# Patient Record
Sex: Female | Born: 1965 | Race: Black or African American | Hispanic: No | Marital: Married | Smoking: Never smoker
Health system: Southern US, Community
[De-identification: ages and names within clinical notes are randomized; demographics above are authoritative.]

## PROBLEM LIST (undated history)

## (undated) DIAGNOSIS — E785 Hyperlipidemia, unspecified: Secondary | ICD-10-CM

## (undated) DIAGNOSIS — E079 Disorder of thyroid, unspecified: Secondary | ICD-10-CM

## (undated) DIAGNOSIS — I10 Essential (primary) hypertension: Secondary | ICD-10-CM

## (undated) HISTORY — DX: Disorder of thyroid, unspecified: E07.9

## (undated) HISTORY — DX: Hyperlipidemia, unspecified: E78.5

## (undated) HISTORY — PX: DOPPLER ECHOCARDIOGRAPHY: SHX263

---

## 1999-05-24 ENCOUNTER — Other Ambulatory Visit: Admission: RE | Admit: 1999-05-24 | Discharge: 1999-05-24 | Payer: Self-pay | Admitting: Family Medicine

## 1999-08-01 ENCOUNTER — Other Ambulatory Visit: Admission: RE | Admit: 1999-08-01 | Discharge: 1999-08-01 | Payer: Self-pay | Admitting: *Deleted

## 2000-07-18 ENCOUNTER — Other Ambulatory Visit: Admission: RE | Admit: 2000-07-18 | Discharge: 2000-07-18 | Payer: Self-pay | Admitting: Family Medicine

## 2002-03-19 ENCOUNTER — Encounter: Payer: Self-pay | Admitting: Emergency Medicine

## 2002-03-19 ENCOUNTER — Emergency Department (HOSPITAL_COMMUNITY): Admission: EM | Admit: 2002-03-19 | Discharge: 2002-03-19 | Payer: Self-pay | Admitting: Emergency Medicine

## 2009-12-14 ENCOUNTER — Other Ambulatory Visit: Admission: RE | Admit: 2009-12-14 | Discharge: 2009-12-14 | Payer: Self-pay | Admitting: Family Medicine

## 2011-09-25 ENCOUNTER — Encounter (HOSPITAL_COMMUNITY): Payer: Self-pay

## 2011-09-25 ENCOUNTER — Emergency Department (HOSPITAL_COMMUNITY)
Admission: EM | Admit: 2011-09-25 | Discharge: 2011-09-25 | Disposition: A | Discharge status: Home / Self Care | Payer: BC Managed Care – PPO | Attending: Emergency Medicine | Admitting: Emergency Medicine

## 2011-09-25 DIAGNOSIS — M542 Cervicalgia: Secondary | ICD-10-CM | POA: Insufficient documentation

## 2011-09-25 DIAGNOSIS — I10 Essential (primary) hypertension: Secondary | ICD-10-CM | POA: Insufficient documentation

## 2011-09-25 DIAGNOSIS — M62838 Other muscle spasm: Secondary | ICD-10-CM | POA: Insufficient documentation

## 2011-09-25 HISTORY — DX: Essential (primary) hypertension: I10

## 2011-09-25 MED ORDER — KETOROLAC TROMETHAMINE 60 MG/2ML IM SOLN
60.0000 mg | Freq: Once | INTRAMUSCULAR | Status: AC
  Administered 2011-09-25: 60 mg via INTRAMUSCULAR
  Filled 2011-09-25: qty 2

## 2011-09-25 MED ORDER — METHOCARBAMOL 500 MG PO TABS: 500.0000 mg | ORAL_TABLET | Freq: Two times a day (BID) | ORAL | Status: AC

## 2011-09-25 MED ORDER — IBUPROFEN 600 MG PO TABS: 600.0000 mg | ORAL_TABLET | Freq: Four times a day (QID) | ORAL | Status: AC | PRN

## 2011-09-25 MED ORDER — TRAMADOL HCL 50 MG PO TABS
50.0000 mg | ORAL_TABLET | Freq: Once | ORAL | Status: AC
  Administered 2011-09-25: 50 mg via ORAL
  Filled 2011-09-25: qty 1

## 2011-09-25 MED ORDER — METHOCARBAMOL 500 MG PO TABS
1000.0000 mg | ORAL_TABLET | Freq: Once | ORAL | Status: AC
  Administered 2011-09-25: 1000 mg via ORAL
  Filled 2011-09-25 (×2): qty 1

## 2011-09-25 MED ORDER — TRAMADOL HCL 50 MG PO TABS: 50.0000 mg | ORAL_TABLET | Freq: Four times a day (QID) | ORAL | Status: AC | PRN

## 2011-09-25 NOTE — Discharge Instructions (Signed)
Muscle Strain A muscle strain, or pulled muscle, occurs when a muscle is over-stretched. A small number of muscle fibers may also be torn. This is especially common in athletes. This happens when a sudden violent force placed on a muscle pushes it past its capacity. Usually, recovery from a pulled muscle takes 1 to 2 weeks. But complete healing will take 5 to 6 weeks. There are millions of muscle fibers. Following injury, your body will usually return to normal quickly. HOME CARE INSTRUCTIONS   While awake, apply ice to the sore muscle for 15 to 20 minutes each hour for the first 2 days. Put ice in a plastic bag and place a towel between the bag of ice and your skin.   Do not use the pulled muscle for several days. Do not use the muscle if you have pain.   You may wrap the injured area with an elastic bandage for comfort. Be careful not to bind it too tightly. This may interfere with blood circulation.   Only take over-the-counter or prescription medicines for pain, discomfort, or fever as directed by your caregiver. Do not use aspirin as this will increase bleeding (bruising) at injury site.   Warming up before exercise helps prevent muscle strains.  SEEK MEDICAL CARE IF:  There is increased pain or swelling in the affected area. MAKE SURE YOU:   Understand these instructions.   Will watch your condition.   Will get help right away if you are not doing well or get worse.  Document Released: 04/16/2005 Document Revised: 04/05/2011 Document Reviewed: 11/13/2006 [employer] Patient Information 2012 [employer]. 

## 2011-09-25 NOTE — ED Notes (Signed)
Symptoms onset on Friday, neck pain, tender to touch, no dizziness, nausea, headache, visual problem, no stiff neck, able to turn from side to side. Was lifting heavy object at work Thursday.

## 2011-09-25 NOTE — ED Provider Notes (Signed)
History     CSN: [id number]  Arrival date & time 09/25/11  4098   First MD Initiated Contact with Patient 09/25/11 0831      Chief Complaint  Patient presents with  . Neck Pain    (Consider location/radiation/quality/duration/timing/severity/associated sxs/prior treatment) HPI Pt woke with neck pain on Friday morning. She states she thinks she slept on it wrong. Pain is worse on the right and worse with movement or palpation. No trauma, fever, chills, rigidity, focal weakness, sensory changes. Pt has been taking aleve at home with some relief.  Past Medical History  Diagnosis Date  . Hypertension     Past Surgical History  Procedure Date  . Cesarean section     No family history on file.  History  Substance Use Topics  . Smoking status: Never Smoker   . Smokeless tobacco: Not on file  . Alcohol Use: Yes     occasionally    OB History    Grav Para Term Preterm Abortions TAB SAB Ect Mult Living                  Review of Systems  Constitutional: Negative for fever and chills.  HENT: Positive for neck pain. Negative for neck stiffness.   Musculoskeletal: Negative for back pain.  Skin: Negative for rash and wound.  Neurological: Negative for weakness, numbness and headaches.    Allergies  Review of patient's allergies indicates no known allergies.  Home Medications   Current Outpatient Rx  Name Route Sig Dispense Refill  . ALISKIREN-HYDROCHLOROTHIAZIDE 150-12.5 MG PO TABS Oral Take 1 tablet by mouth daily.    Marland Kitchen NAPROXEN SODIUM 220 MG PO TABS Oral Take 220 mg by mouth 2 (two) times daily with a meal. Pain    . IBUPROFEN 600 MG PO TABS Oral Take 1 tablet (600 mg total) by mouth every 6 (six) hours as needed for pain. 30 tablet 0  . METHOCARBAMOL 500 MG PO TABS Oral Take 1 tablet (500 mg total) by mouth 2 (two) times daily. 30 tablet 0  . TRAMADOL HCL 50 MG PO TABS Oral Take 1 tablet (50 mg total) by mouth every 6 (six) hours as needed for pain. 15 tablet 0     BP 137/97  Pulse 96  Temp(Src) 97.7 F (36.5 C) (Oral)  Resp 18  SpO2 99%  Physical Exam  Nursing note and vitals reviewed. Constitutional: She is oriented to person, place, and time. She appears well-developed and well-nourished. No distress.  HENT:  Head: Normocephalic and atraumatic.  Mouth/Throat: Oropharynx is clear and moist.  Eyes: EOM are normal. Pupils are equal, round, and reactive to light.  Neck: Normal range of motion. Neck supple.       No meningismus. TTP over R paraspinal muscles with spasm.   Cardiovascular: Normal rate and regular rhythm.   Pulmonary/Chest: Effort normal and breath sounds normal. No respiratory distress. She has no wheezes. She has no rales.  Abdominal: Soft. Bowel sounds are normal. There is no tenderness. There is no rebound and no guarding.  Musculoskeletal: Normal range of motion. She exhibits no edema and no tenderness.  Neurological: She is alert and oriented to person, place, and time.       5/5 motor, sensation intact  Skin: Skin is warm and dry. No rash noted. No erythema.  Psychiatric: She has a normal mood and affect. Her behavior is normal.    ED Course  Procedures (including critical care time)  Labs Reviewed - No  data to display No results found.   1. Spasm of cervical paraspinous muscle       MDM  Will treat symptomatically and d/c with RX. Return for worsening pain, fever, weakness.        Loren Racer, MD 09/25/11 [phone]

## 2014-02-22 ENCOUNTER — Ambulatory Visit (INDEPENDENT_AMBULATORY_CARE_PROVIDER_SITE_OTHER): Payer: Self-pay | Admitting: Emergency Medicine

## 2014-02-22 VITALS — BP 120/96 | HR 90 | Temp 98.3°F | Resp 18 | Ht 64.0 in | Wt 214.0 lb

## 2014-02-22 DIAGNOSIS — B356 Tinea cruris: Secondary | ICD-10-CM

## 2014-02-22 MED ORDER — ALUM SULFATE-CA ACETATE EX PACK: 1.0000 | PACK | Freq: Three times a day (TID) | CUTANEOUS | Status: DC

## 2014-02-22 MED ORDER — TERBINAFINE HCL 250 MG PO TABS: 250.0000 mg | ORAL_TABLET | Freq: Every day | ORAL | Status: DC

## 2014-02-22 NOTE — Patient Instructions (Signed)

## 2014-02-22 NOTE — Progress Notes (Signed)
[facility] [address] [place] [address] [phone]  Date:  02/22/2014   Name:  Ashlyne Olenick   DOB:  02/24/1966   MRN:  [id number]  PCP:  No PCP Per Patient    Chief Complaint: Rash   History of Present Illness:  Jane Nunez is a 48 y.o. very pleasant female patient who presents with the following:  Patient has a history of a rash on the inner thighs.  Started over a week ago and improved transiently with topical meds then returned. Pruritic No fever or chills.  No nausea or vomiting. No improvement with over the counter medications or other home remedies. Denies other complaint or health concern today.   There are no active problems to display for this patient.   Past Medical History  Diagnosis Date  . Hypertension     Past Surgical History  Procedure Laterality Date  . Cesarean section      History  Substance Use Topics  . Smoking status: Never Smoker   . Smokeless tobacco: Not on file  . Alcohol Use: Yes     Comment: occasionally    Family History  Problem Relation Age of Onset  . Hypertension Mother   . Diabetes Mother   . Aneurysm Father   . Hypertension Daughter     No Known Allergies  Medication list has been reviewed and updated.  No current outpatient prescriptions on file prior to visit.   No current facility-administered medications on file prior to visit.    Review of Systems:  As per HPI, otherwise negative.    Physical Examination: Filed Vitals:   02/22/14 1533  BP: 120/96  Pulse: 90  Temp: 98.3 F (36.8 C)  Resp: 18   Filed Vitals:   02/22/14 1533  Height: 5\' 4"  (1.626 m)  Weight: 214 lb (97.07 kg)   Body mass index is 36.72 kg/(m^2). Ideal Body Weight: Weight in (lb) to have BMI = 25: 145.3   GEN: WDWN, NAD, Non-toxic, Alert & Oriented x 3 HEENT: Atraumatic, Normocephalic.  Ears and Nose: No external deformity. EXTR: No clubbing/cyanosis/edema NEURO: Normal gait.  PSYCH: Normally  interactive. Conversant. Not depressed or anxious appearing.  Calm demeanor.  Tinea cruris   Assessment and Plan: Tinea cruris lamisil Dry powder Domeboro   Signed,  Ellison Carwin, MD

## 2014-03-07 ENCOUNTER — Other Ambulatory Visit: Payer: Self-pay | Admitting: Emergency Medicine

## 2014-03-09 NOTE — Telephone Encounter (Signed)
Do you want to RF this?

## 2015-06-17 ENCOUNTER — Encounter (HOSPITAL_COMMUNITY): Payer: Self-pay | Admitting: Family Medicine

## 2015-06-17 ENCOUNTER — Emergency Department (HOSPITAL_COMMUNITY)
Admission: EM | Admit: 2015-06-17 | Discharge: 2015-06-17 | Disposition: A | Discharge status: Home / Self Care | Payer: 59 | Attending: Emergency Medicine | Admitting: Emergency Medicine

## 2015-06-17 ENCOUNTER — Emergency Department (HOSPITAL_COMMUNITY): Payer: 59

## 2015-06-17 DIAGNOSIS — J069 Acute upper respiratory infection, unspecified: Secondary | ICD-10-CM | POA: Insufficient documentation

## 2015-06-17 DIAGNOSIS — I10 Essential (primary) hypertension: Secondary | ICD-10-CM | POA: Diagnosis not present

## 2015-06-17 DIAGNOSIS — R0602 Shortness of breath: Secondary | ICD-10-CM | POA: Diagnosis present

## 2015-06-17 LAB — CBC
HEMATOCRIT: 40.6 % (ref 36.0–46.0)
HEMOGLOBIN: 13.6 g/dL (ref 12.0–15.0)
MCH: 30.2 pg (ref 26.0–34.0)
MCHC: 33.5 g/dL (ref 30.0–36.0)
MCV: 90.2 fL (ref 78.0–100.0)
Platelets: DECREASED 10*3/uL (ref 150–400)
RBC: 4.5 MIL/uL (ref 3.87–5.11)
RDW: 13 % (ref 11.5–15.5)
WBC: 4.4 10*3/uL (ref 4.0–10.5)

## 2015-06-17 LAB — BASIC METABOLIC PANEL
ANION GAP: 13 (ref 5–15)
BUN: 8 mg/dL (ref 6–20)
CO2: 19 mmol/L — AB (ref 22–32)
Calcium: 9.1 mg/dL (ref 8.9–10.3)
Chloride: 103 mmol/L (ref 101–111)
Creatinine, Ser: 0.67 mg/dL (ref 0.44–1.00)
GFR calc Af Amer: >60 mL/min (ref >60)
GLUCOSE: 90 mg/dL (ref 65–99)
POTASSIUM: 4.3 mmol/L (ref 3.5–5.1)
Sodium: 135 mmol/L (ref 135–145)

## 2015-06-17 LAB — I-STAT TROPONIN, ED: Troponin i, poc: 0 ng/mL (ref 0.00–0.08)

## 2015-06-17 IMAGING — DX DG CHEST 2V
2 series · 2 of 2 positions shown · non-contrast
Comparison: None.

CLINICAL DATA: Chills

EXAM:
CHEST  2 VIEW

[chest pa]
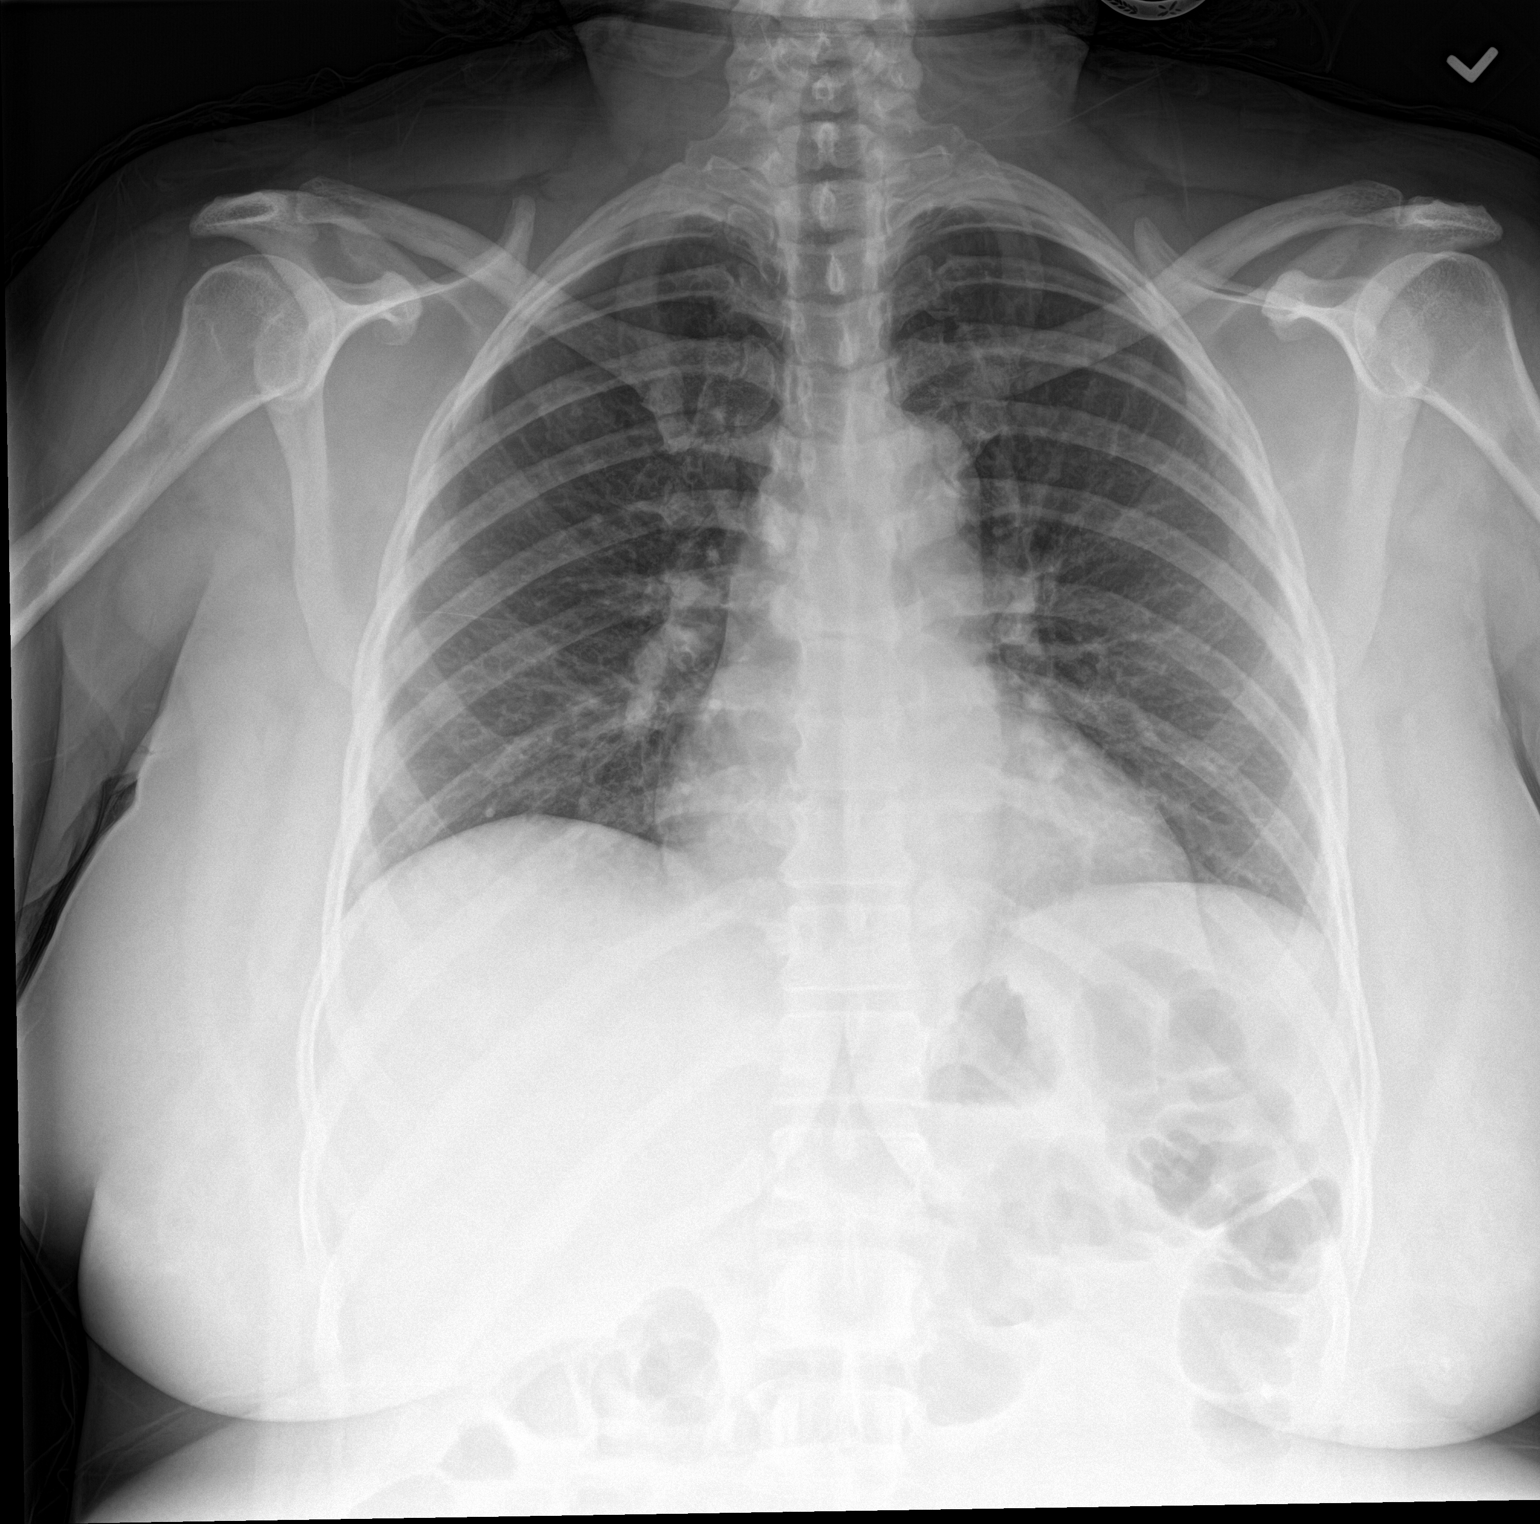

[chest lat]
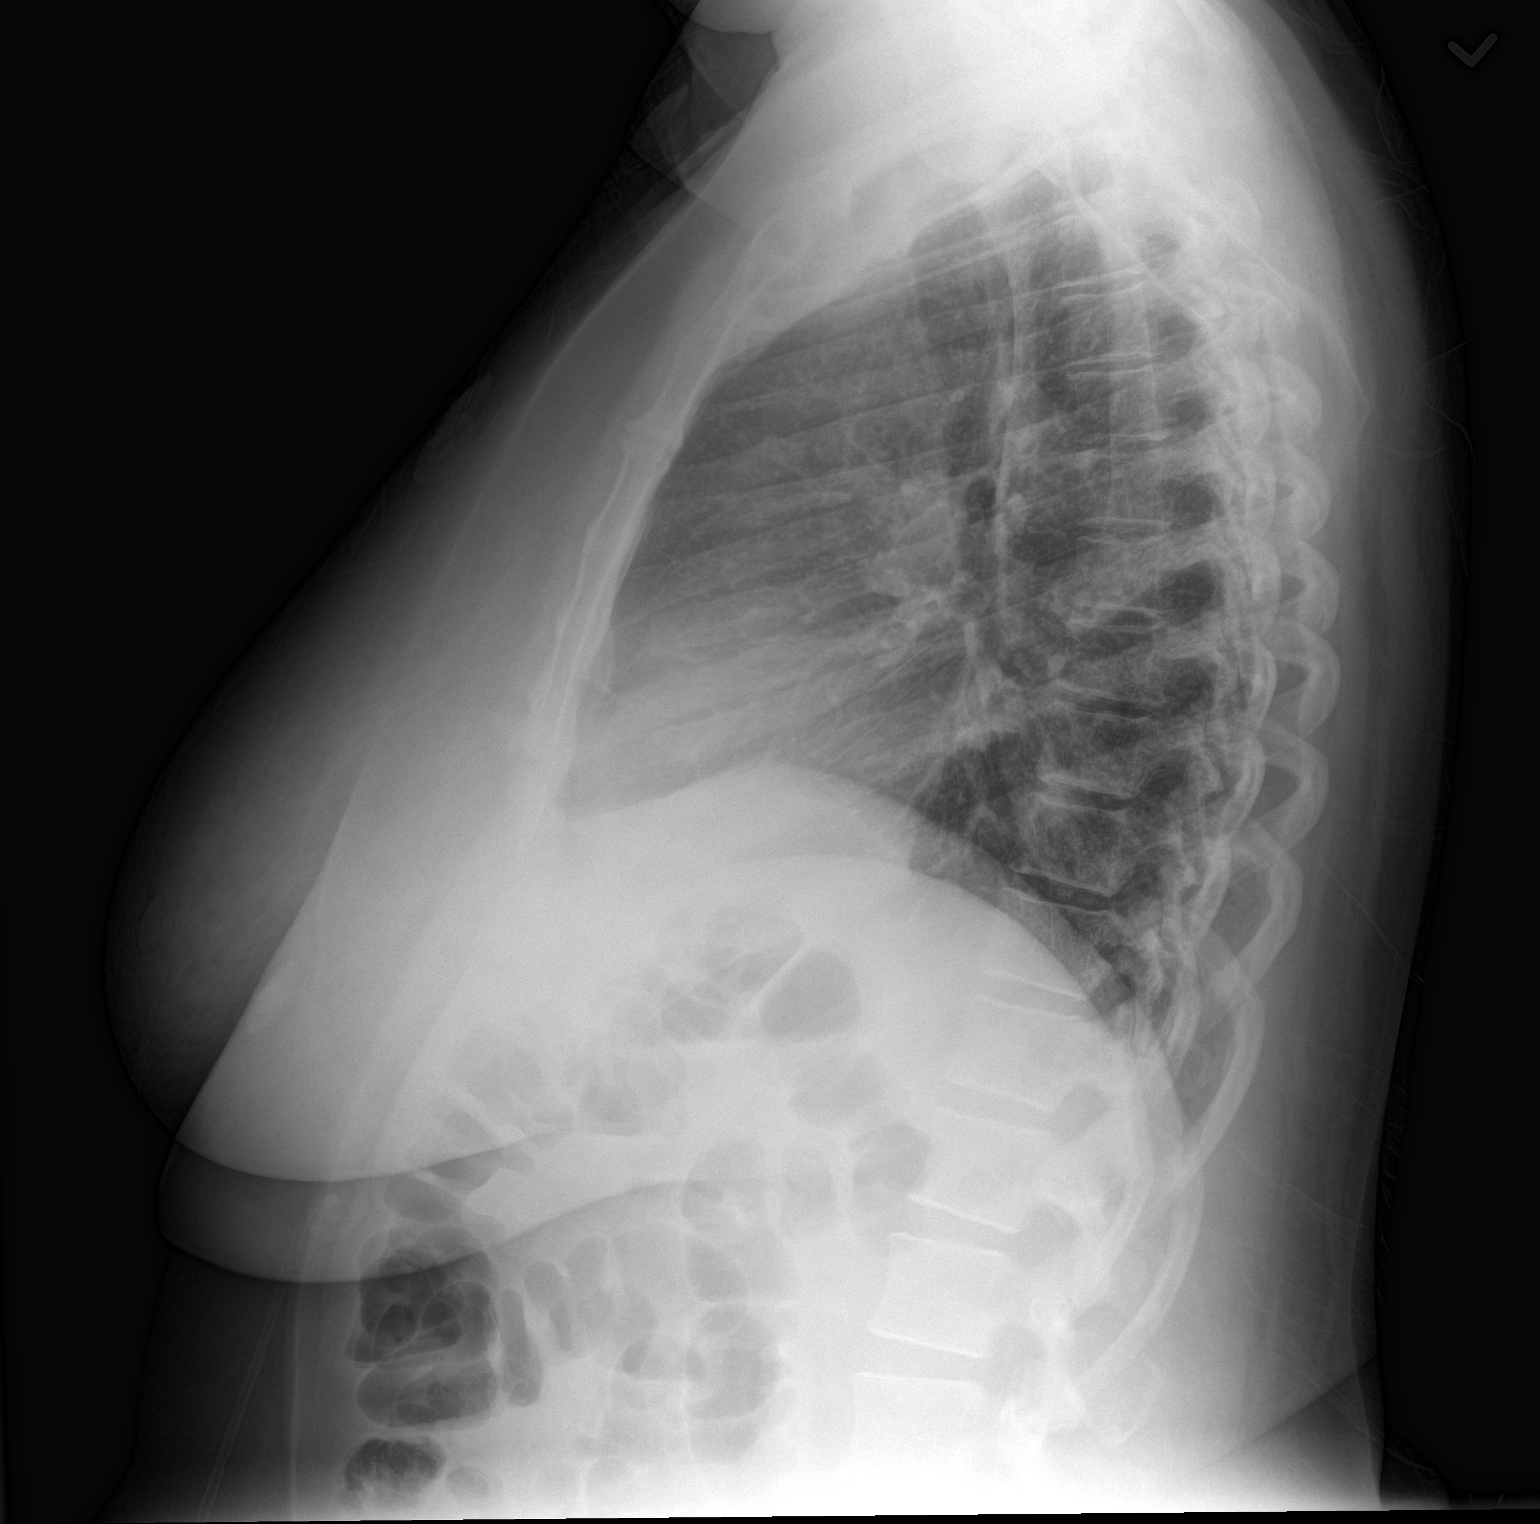

[2 of 2 positions shown; findings below may reference images not displayed]

FINDINGS: The heart size and mediastinal contours are within normal limits.
Both lungs are clear. The visualized skeletal structures are
unremarkable.
IMPRESSION: No active cardiopulmonary disease.

## 2015-06-17 MED ORDER — BENZONATATE 100 MG PO CAPS: 100.0000 mg | ORAL_CAPSULE | Freq: Three times a day (TID) | ORAL | Status: DC

## 2015-06-17 MED ORDER — GUAIFENESIN ER 600 MG PO TB12: 600.0000 mg | ORAL_TABLET | Freq: Two times a day (BID) | ORAL | Status: DC

## 2015-06-17 NOTE — ED Notes (Signed)
Pt discharged with all belongings. Follow-up instructions given and pt verbalized understanding.

## 2015-06-17 NOTE — ED Provider Notes (Signed)
CSN: [id number]     Arrival date & time 06/17/15  1128 History   First MD Initiated Contact with Patient 06/17/15 [phone]     Chief Complaint  Patient presents with  . Shortness of Breath  . Cough    HPI Comments: Patient started having symptoms a few days ago where she felt like she was getting the flu. She began having trouble with shortness of breath and cough. She developed mucus and congestion in her chest. She felt like it started to affect her breathing so she decided coming to the emergency room for evaluation. She denies any trouble with any chest pain. No leg swelling. No history of pulmonary embolism or DVT. Does have a history of hypertension and used to be on medications but her doctor took her off them because her blood pressure remained low and she was in a very low dose of medications. She has been taking over-the-counter medications for her cough and congestion recently.  Patient is a 50 y.o. female presenting with shortness of breath and cough.  Shortness of Breath Severity:  Moderate Onset quality:  Gradual Duration: a few days. Timing:  Constant Chronicity:  New Relieved by:  Nothing Ineffective treatments: OTC cough medications. Associated symptoms: cough   Associated symptoms: no abdominal pain, no fever, no PND, no rash, no sore throat, no sputum production and no wheezing   Risk factors: no family hx of DVT, no hx of PE/DVT and no prolonged immobilization   Cough Associated symptoms: shortness of breath   Associated symptoms: no fever, no rash, no sore throat and no wheezing     Past Medical History  Diagnosis Date  . Hypertension    Past Surgical History  Procedure Laterality Date  . Cesarean section     Family History  Problem Relation Age of Onset  . Hypertension Mother   . Diabetes Mother   . Aneurysm Father   . Hypertension Daughter    Social History  Substance Use Topics  . Smoking status: Never Smoker   . Smokeless tobacco: None  . Alcohol  Use: Yes     Comment: occasionally   OB History    No data available     Review of Systems  Constitutional: Negative for fever.  HENT: Negative for sore throat.   Respiratory: Positive for cough and shortness of breath. Negative for sputum production and wheezing.   Cardiovascular: Negative for PND.  Gastrointestinal: Negative for abdominal pain.  Skin: Negative for rash.  All other systems reviewed and are negative.     Allergies  Review of patient's allergies indicates no known allergies.  Home Medications   Prior to Admission medications   Medication Sig Start Date End Date Taking? Authorizing Provider  aluminum sulfate-calcium acetate (DOMEBORO) packet Apply 1 packet topically 3 (three) times daily. Patient not taking: Reported on 06/17/2015 02/22/14   Roselee Culver, MD  terbinafine (LAMISIL) 250 MG tablet TAKE 1 TABLET (250 MG TOTAL) BY MOUTH DAILY. Patient not taking: Reported on 06/17/2015 03/10/14   Roselee Culver, MD   BP 152/98 mmHg  Pulse 80  Temp(Src) 98.1 F (36.7 C) (Oral)  Resp 24  SpO2 96% Physical Exam  Constitutional: She appears well-developed and well-nourished. No distress.  HENT:  Head: Normocephalic and atraumatic.  Right Ear: External ear normal.  Left Ear: External ear normal.  Eyes: Conjunctivae are normal. Right eye exhibits no discharge. Left eye exhibits no discharge. No scleral icterus.  Neck: Neck supple. No tracheal deviation present.  Cardiovascular: Normal rate, regular rhythm and intact distal pulses.   Pulmonary/Chest: Effort normal and breath sounds normal. No stridor. No respiratory distress. She has no wheezes. She has no rales.  Abdominal: Soft. Bowel sounds are normal. She exhibits no distension. There is no tenderness. There is no rebound and no guarding.  Musculoskeletal: She exhibits no edema or tenderness.  Neurological: She is alert. She has normal strength. No cranial nerve deficit (no facial droop, extraocular  movements intact, no slurred speech) or sensory deficit. She exhibits normal muscle tone. She displays no seizure activity. Coordination normal.  Skin: Skin is warm and dry. No rash noted.  Psychiatric: She has a normal mood and affect.  Nursing note and vitals reviewed.   ED Course  Procedures (including critical care time) Labs Review Labs Reviewed  BASIC METABOLIC PANEL - Abnormal; Notable for the following:    CO2 19 (*)    All other components within normal limits  CBC  I-STAT TROPOININ, ED    Imaging Review Dg Chest 2 View  06/17/2015  CLINICAL DATA:  Chills EXAM: CHEST  2 VIEW COMPARISON:  None. FINDINGS: The heart size and mediastinal contours are within normal limits. Both lungs are clear. The visualized skeletal structures are unremarkable. IMPRESSION: No active cardiopulmonary disease. Electronically Signed   By: Marybelle Killings M.D.   On: 06/17/2015 12:41   I have personally reviewed and evaluated these images and lab results as part of my medical decision-making.   EKG Interpretation   Date/Time:  Friday June 17 2015 12:14:14 EST Ventricular Rate:  80 PR Interval:  166 QRS Duration: 76 QT Interval:  392 QTC Calculation: 452 R Axis:   29 Text Interpretation:  Normal sinus rhythm Normal ECG No previous tracing  Confirmed by Francesco Provencal  MD-J, Yichen Gilardi [id number]) on 06/17/2015 4:02:51 PM      MDM   Final diagnoses:  URI, acute    Patient is breathing easily, she has a normal oxygen saturation. She has no leg swelling or risk factors for pulmonary embolism.  PERC negative.  Doubt PE.  HTN but labs are normal and no evidence of CHF.  Doubt this is causing her respiratory difficulty.  Over-the-counter cold medications could be contributing. We discussed follow-up with the primary care doctor once her illness has resolved  The patient's symptoms are suggestive of a viral upper respiratory infection. We will discharge home with prescription for Tessalon and guaifenesin. Follow  up with her primary doctor if not getting better in one week     Dorie Rank, MD 06/17/15 [phone]

## 2015-06-17 NOTE — ED Notes (Signed)
Pt here for cough, SOB with exertion. Denies chest pain. sts congestion. Pt hypertensive at triage 178/117

## 2015-06-17 NOTE — Discharge Instructions (Signed)
Upper Respiratory Infection, Adult Most upper respiratory infections (URIs) are a viral infection of the air passages leading to the lungs. A URI affects the nose, throat, and upper air passages. The most common type of URI is nasopharyngitis and is typically referred to as "the common cold." URIs run their course and usually go away on their own. Most of the time, a URI does not require medical attention, but sometimes a bacterial infection in the upper airways can follow a viral infection. This is called a secondary infection. Sinus and middle ear infections are common types of secondary upper respiratory infections. Bacterial pneumonia can also complicate a URI. A URI can worsen asthma and chronic obstructive pulmonary disease (COPD). Sometimes, these complications can require emergency medical care and may be life threatening.  CAUSES Almost all URIs are caused by viruses. A virus is a type of germ and can spread from one person to another.  RISKS FACTORS You may be at risk for a URI if:   You smoke.   You have chronic heart or lung disease.  You have a weakened defense (immune) system.   You are very young or very old.   You have nasal allergies or asthma.  You work in crowded or poorly ventilated areas.  You work in health care facilities or schools. SIGNS AND SYMPTOMS  Symptoms typically develop 2-3 days after you come in contact with a cold virus. Most viral URIs last 7-10 days. However, viral URIs from the influenza virus (flu virus) can last 14-18 days and are typically more severe. Symptoms may include:   Runny or stuffy (congested) nose.   Sneezing.   Cough.   Sore throat.   Headache.   Fatigue.   Fever.   Loss of appetite.   Pain in your forehead, behind your eyes, and over your cheekbones (sinus pain).  Muscle aches.  DIAGNOSIS  Your health care provider may diagnose a URI by:  Physical exam.  Tests to check that your symptoms are not due to  another condition such as:  Strep throat.  Sinusitis.  Pneumonia.  Asthma. TREATMENT  A URI goes away on its own with time. It cannot be cured with medicines, but medicines may be prescribed or recommended to relieve symptoms. Medicines may help:  Reduce your fever.  Reduce your cough.  Relieve nasal congestion. HOME CARE INSTRUCTIONS   Take medicines only as directed by your health care provider.   Gargle warm saltwater or take cough drops to comfort your throat as directed by your health care provider.  Use a warm mist humidifier or inhale steam from a shower to increase air moisture. This may make it easier to breathe.  Drink enough fluid to keep your urine clear or pale yellow.   Eat soups and other clear broths and maintain good nutrition.   Rest as needed.   Return to work when your temperature has returned to normal or as your health care provider advises. You may need to stay home longer to avoid infecting others. You can also use a face mask and careful hand washing to prevent spread of the virus.  Increase the usage of your inhaler if you have asthma.   Do not use any tobacco products, including cigarettes, chewing tobacco, or electronic cigarettes. If you need help quitting, ask your health care provider. PREVENTION  The best way to protect yourself from getting a cold is to practice good hygiene.   Avoid oral or hand contact with people with cold   symptoms.   Wash your hands often if contact occurs.  There is no clear evidence that vitamin C, vitamin E, echinacea, or exercise reduces the chance of developing a cold. However, it is always recommended to get plenty of rest, exercise, and practice good nutrition.  SEEK MEDICAL CARE IF:   You are getting worse rather than better.   Your symptoms are not controlled by medicine.   You have chills.  You have worsening shortness of breath.  You have brown or red mucus.  You have yellow or brown nasal  discharge.  You have pain in your face, especially when you bend forward.  You have a fever.  You have swollen neck glands.  You have pain while swallowing.  You have white areas in the back of your throat. SEEK IMMEDIATE MEDICAL CARE IF:   You have severe or persistent:  Headache.  Ear pain.  Sinus pain.  Chest pain.  You have chronic lung disease and any of the following:  Wheezing.  Prolonged cough.  Coughing up blood.  A change in your usual mucus.  You have a stiff neck.  You have changes in your:  Vision.  Hearing.  Thinking.  Mood. MAKE SURE YOU:   Understand these instructions.  Will watch your condition.  Will get help right away if you are not doing well or get worse.   This information is not intended to replace advice given to you by your health care provider. Make sure you discuss any questions you have with your health care provider.   Document Released: 10/10/2000 Document Revised: 08/31/2014 Document Reviewed: 07/22/2013 Elsevier Interactive Patient Education 2016 [employer]  

## 2021-01-10 ENCOUNTER — Other Ambulatory Visit: Payer: Self-pay | Admitting: Obstetrics and Gynecology

## 2021-01-10 DIAGNOSIS — R1900 Intra-abdominal and pelvic swelling, mass and lump, unspecified site: Secondary | ICD-10-CM

## 2021-01-26 ENCOUNTER — Ambulatory Visit
Admission: RE | Admit: 2021-01-26 | Discharge: 2021-01-26 | Disposition: A | Discharge status: Home / Self Care | Payer: 59 | Source: Ambulatory Visit | Attending: Obstetrics and Gynecology | Admitting: Obstetrics and Gynecology

## 2021-01-26 ENCOUNTER — Other Ambulatory Visit: Payer: Self-pay

## 2021-01-26 DIAGNOSIS — R1900 Intra-abdominal and pelvic swelling, mass and lump, unspecified site: Secondary | ICD-10-CM

## 2021-01-26 IMAGING — MR MR ABDOMEN WO/W CM
9 of 12 series · 29 of 48 positions shown · IV contrast (multihance)
Comparison: None.

CLINICAL DATA: Stomach swelling, nausea, fibroids

EXAM:
MRI ABDOMEN AND PELVIS WITHOUT AND WITH CONTRAST
TECHNIQUE: Multiplanar multisequence MR imaging of the abdomen and pelvis was
performed both before and after the administration of intravenous
contrast.
CONTRAST:  20mL MULTIHANCE GADOBENATE DIMEGLUMINE 529 MG/ML IV SOLN

[Series 3: cor haste · coronal · 5.0mm · 0.78mm/px · 2 of 46 slices shown]
[im 1/46]
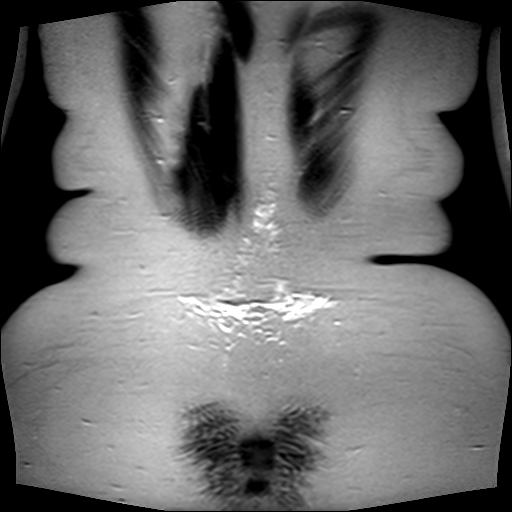
[im 46/46]
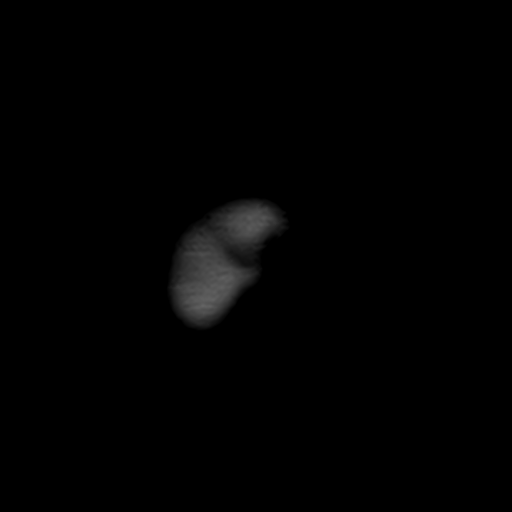

[Series 4: axial haste · axial · 6.0mm · 0.78mm/px · z∈[+52,+275]mm · 2 of 35 slices shown]
[im 1/35]
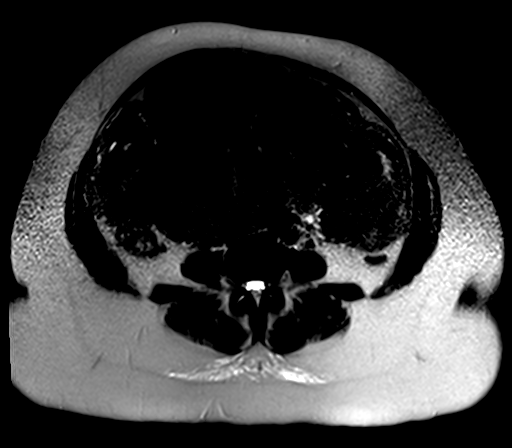
[im 35/35]
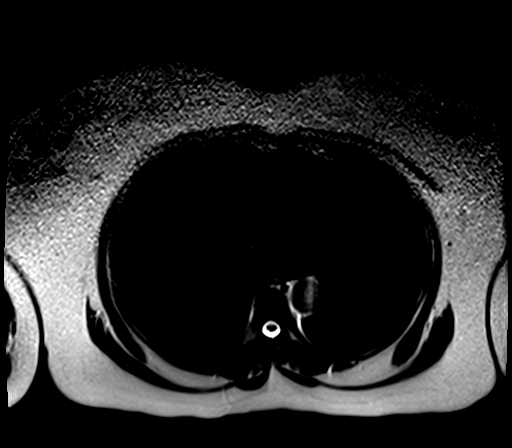

[Series 5: T1 · axial · 6.0mm · 0.78mm/px · z∈[+48,+279]mm · 4 of 72 slices shown]
[im 1/72]
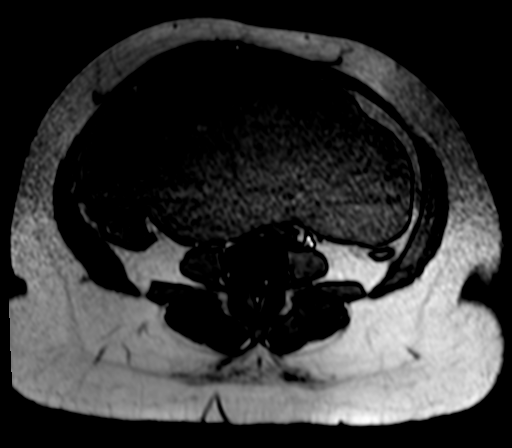
[im 24/72]
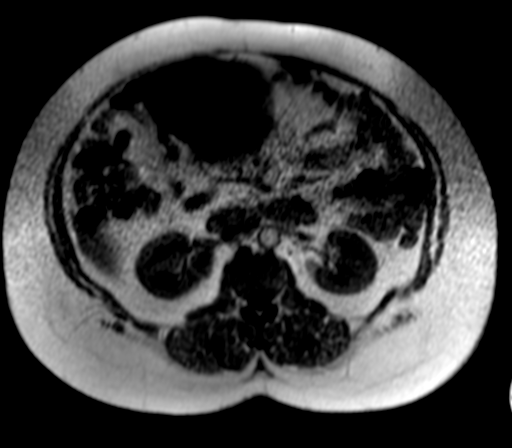
[im 48/72]
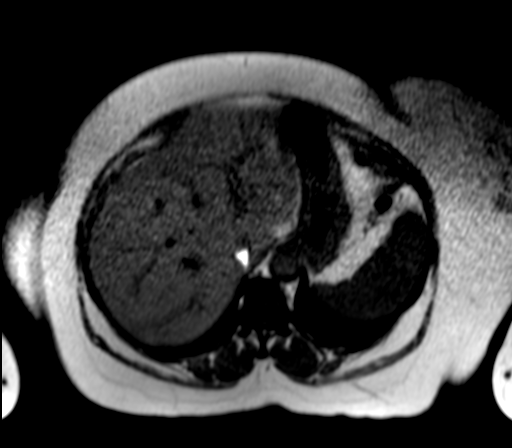
[im 72/72]
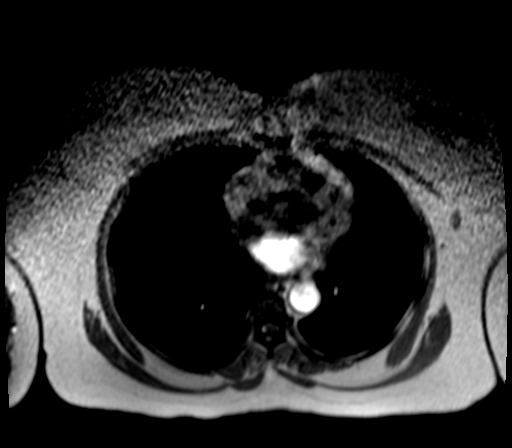

[Series 6: bSSFP · axial · 4.0mm · 0.78mm/px · z∈[+44,+283]mm · 4 of 61 slices shown]
[im 1/61]
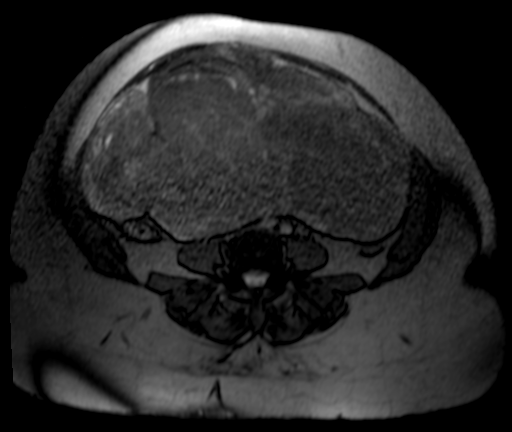
[im 21/61]
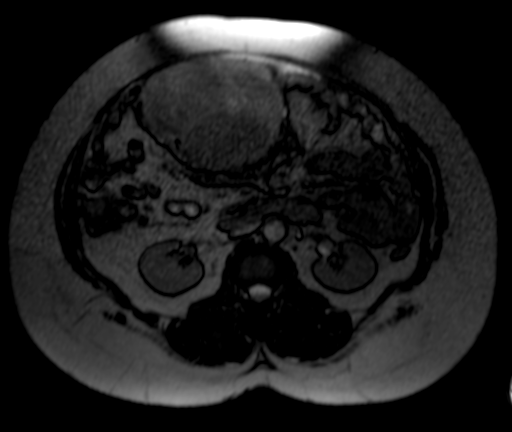
[im 41/61]
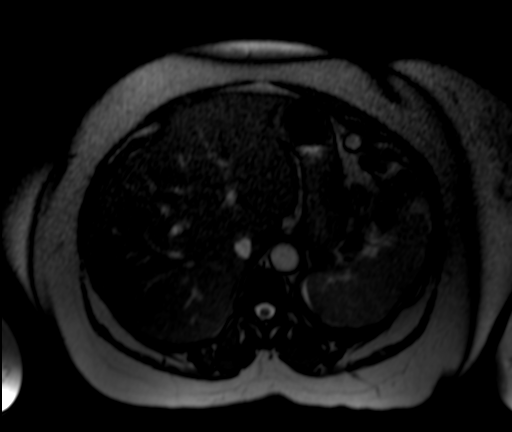
[im 61/61]
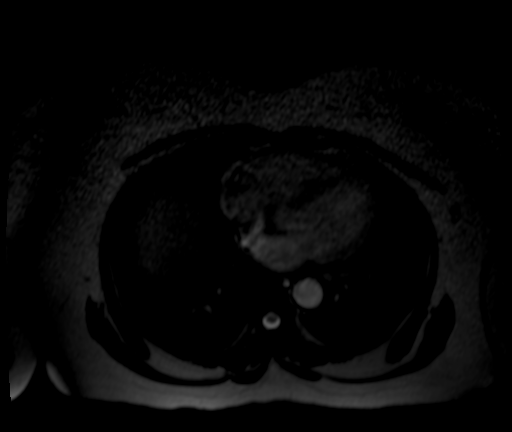

[Series 7: T2 fat-sat · axial · 6.0mm · 1.25mm/px · z∈[+62,+299]mm · 2 of 34 slices shown]
[im 1/34]
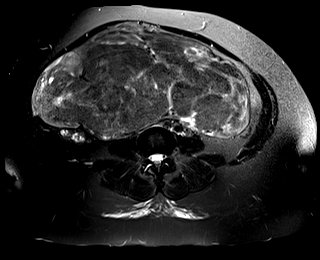
[im 34/34]
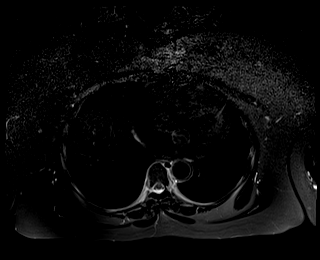

[Series 8: [id number]_trig · axial · 6.0mm · 2.08mm/px · z∈[+62,+299]mm · 6 of 102 slices shown]
[im 1/102]
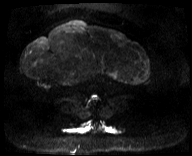
[im 21/102]
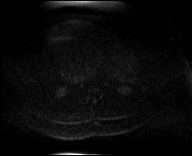
[im 41/102]
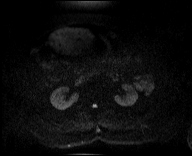
[im 61/102]
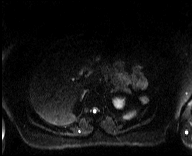
[im 81/102]
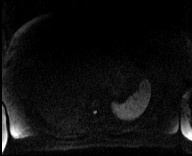
[im 102/102]
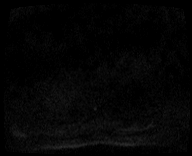

[Series 9: [id number]_trig_adc · axial · 6.0mm · 2.08mm/px · z∈[+62,+299]mm · 2 of 34 slices shown]
[im 1/34]
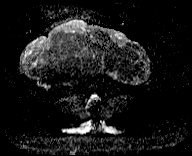
[im 34/34]
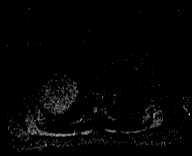

[Series 10: T1 dynamic · axial · non-contrast · 2.5mm · 0.78mm/px · z∈[+45,+282]mm · 6 of 96 slices shown]
[im 1/96]
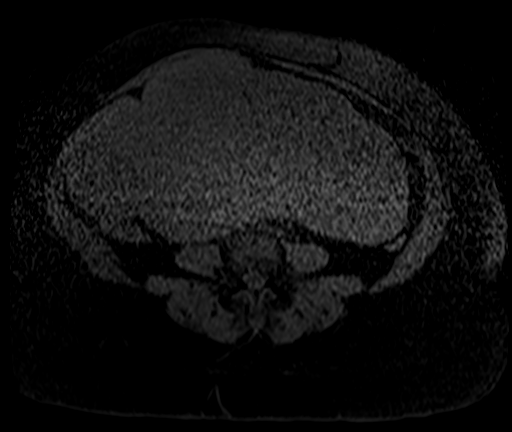
[im 20/96]
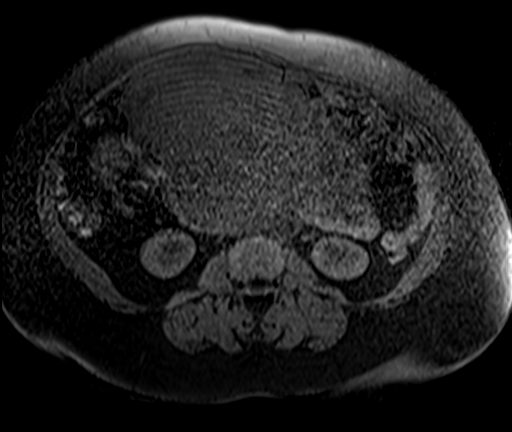
[im 39/96]
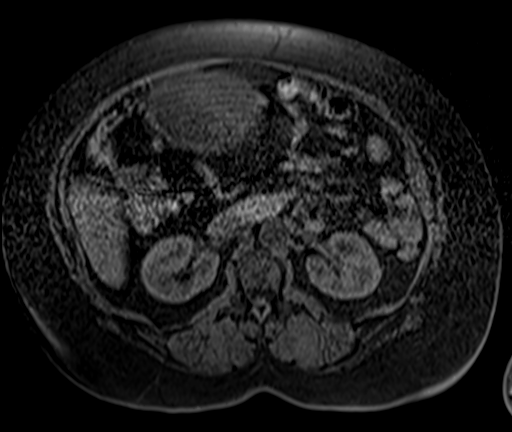
[im 58/96]
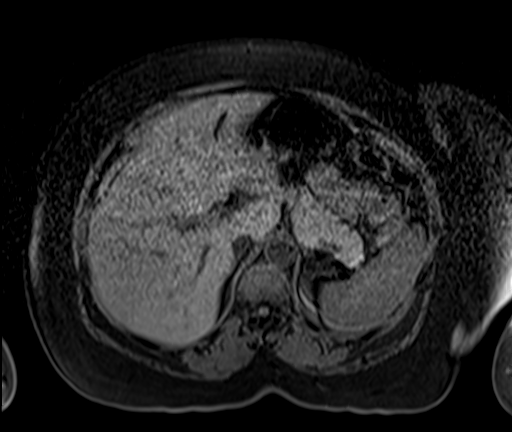
[im 77/96]
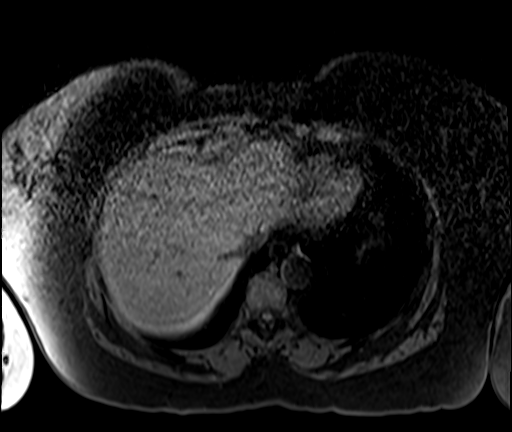
[im 96/96]
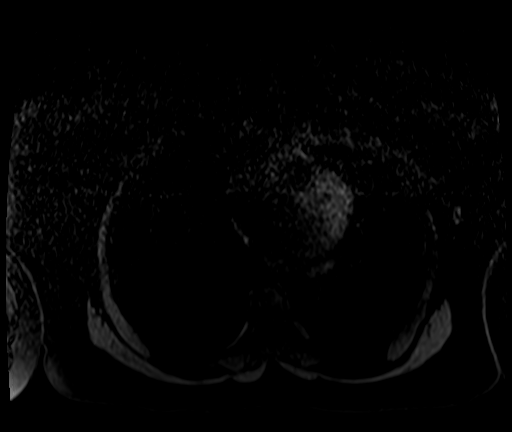

[Series 13: cor haste (include · coronal · 6.0mm · 0.78mm/px · 1 of 38 slices shown]
[im 1/38]
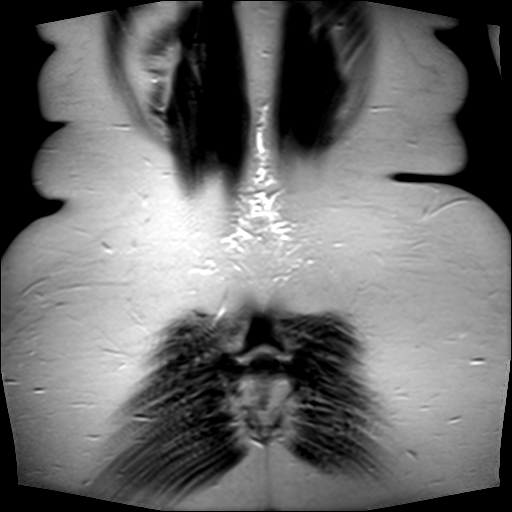

[29 of 48 positions shown; findings below may reference images not displayed]

FINDINGS: COMBINED FINDINGS FOR BOTH MR ABDOMEN AND PELVIS

Lower chest: No acute findings.

Hepatobiliary: Hepatic steatosis, somewhat geographic in the right
lobe. No mass or other parenchymal abnormality identified. No
gallstones. No biliary ductal dilatation.

Pancreas: No mass, inflammatory changes, or other parenchymal
abnormality identified. No pancreatic ductal dilatation.

Spleen:  Within normal limits in size and appearance.

Adrenals/Urinary Tract: No masses identified. No evidence of
hydronephrosis.

Stomach/Bowel: Visualized portions within the abdomen are
unremarkable.

Vascular/Lymphatic: No pathologically enlarged lymph nodes
identified. No abdominal aortic aneurysm demonstrated.

Reproductive: Extremely bulky fibroid uterus, with numerous fibroids
that grossly distort and efface the endometrial cavity. The overall
dimensions of the uterus are at least 26.0 x 25.9 x 14.3 cm. The
largest fibroid is an exophytic or pedunculated fibroid of the
posterior uterine body measuring at least 21.2 x 19.6 x 14.3 cm,
which extends into the upper abdomen.

Other:  None.

Musculoskeletal: No suspicious bone lesions identified.
IMPRESSION: 1. Extremely bulky fibroid uterus, with numerous fibroids that
grossly distort and efface the endometrial cavity. The overall
dimensions of the uterus are at least 26.0 x 25.9 x 14.3 cm.
2. The largest fibroid is an exophytic or pedunculated fibroid of
the posterior uterine body measuring at least 21.2 x 19.6 x 14.3 cm,
which extends into the upper abdomen.
3. Hepatic steatosis.

## 2021-01-26 IMAGING — MR MR PELVIS WO/W CM
18 series · 48 of 48 positions shown · IV contrast (20ml Multihance)
Comparison: None.

CLINICAL DATA: Stomach swelling, nausea, fibroids

EXAM:
MRI ABDOMEN AND PELVIS WITHOUT AND WITH CONTRAST
TECHNIQUE: Multiplanar multisequence MR imaging of the abdomen and pelvis was
performed both before and after the administration of intravenous
contrast.
CONTRAST:  20mL MULTIHANCE GADOBENATE DIMEGLUMINE 529 MG/ML IV SOLN

[Series 1: T2 · axial · 5.0mm · 1.45mm/px · 1 of 55 slices shown]
[im 1/55]
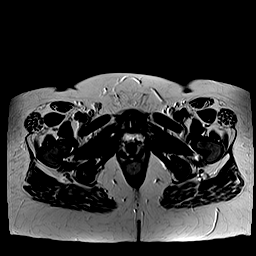

[Series 2: t2_tse axial fs · axial · 5.0mm · 1.45mm/px · 1 of 55 slices shown]
[im 1/55]
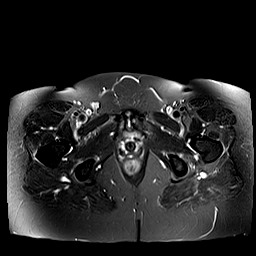

[Series 3: t2_tse_sag · sagittal · 5.0mm · 1.41mm/px · 1 of 56 slices shown (1 of 2)]
[im 1/56]
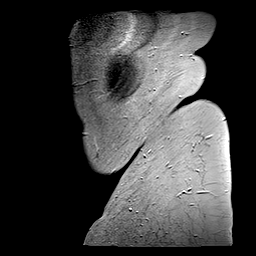

[Series 4: cor haste small · coronal · 5.0mm · 0.68mm/px · 1 of 32 slices shown]
[im 1/32]
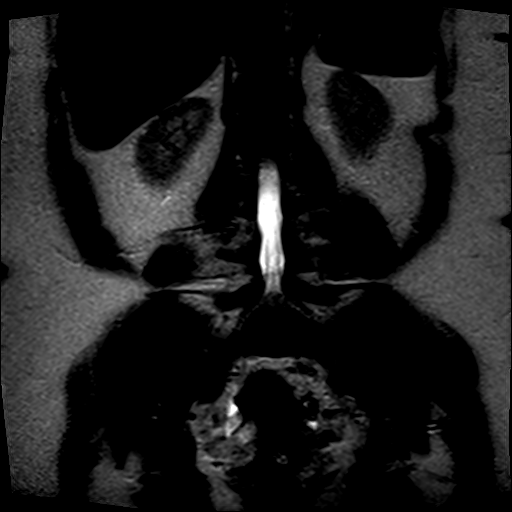

[Series 5: DWI · axial · 6.0mm · 1.93mm/px · z∈[-129,+195]mm · 2 of 138 slices shown]
[im 1/138]
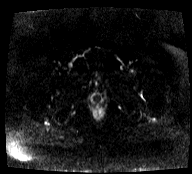
[im 138/138]
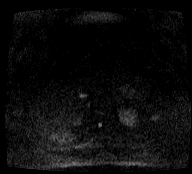

[Series 6: axial dwi_adc · axial · 6.0mm · 1.93mm/px · 1 of 46 slices shown]
[im 1/46]
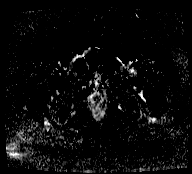

[Series 7: T1 · axial · 5.0mm · 0.66mm/px · z∈[-124,+200]mm · 2 of 110 slices shown]
[im 1/110]
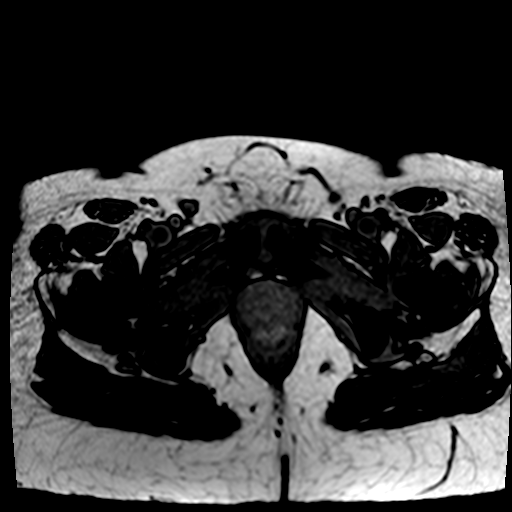
[im 110/110]
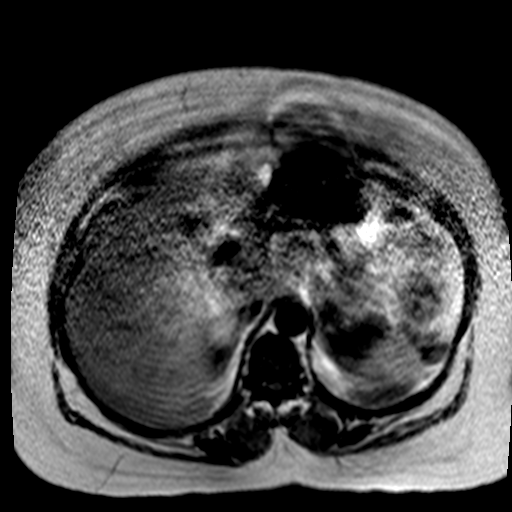

[Series 8: T1 dynamic · axial · non-contrast · 2.3mm · 1.56mm/px · z∈[-124,+204]mm · 4 of 144 slices shown]
[im 1/144]
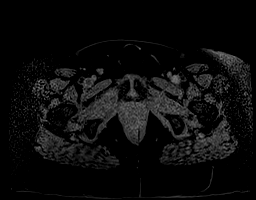
[im 48/144]
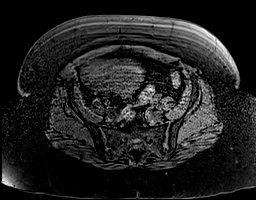
[im 96/144]
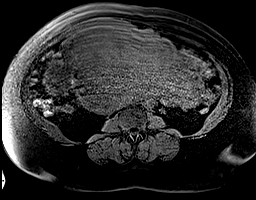
[im 144/144]
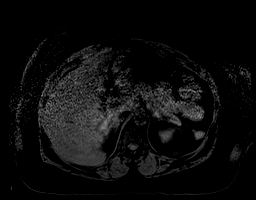

[Series 9: ax post dynamic · axial · 2.3mm · 1.56mm/px · z∈[-124,+204]mm · 4 of 144 slices shown (1 of 2)]
[im 1/144]
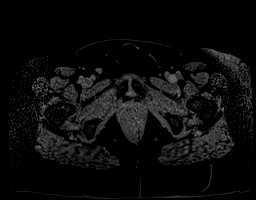
[im 48/144]
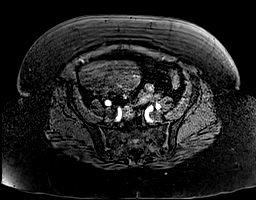
[im 96/144]
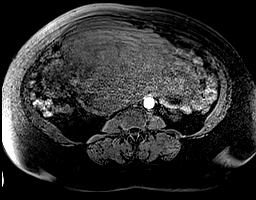
[im 144/144]
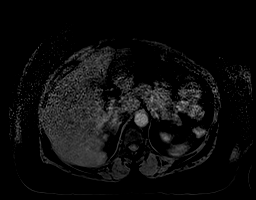

[Series 10: ax post dynamic · axial · 2.3mm · 1.56mm/px · z∈[-124,+204]mm · 4 of 143 slices shown (2 of 2)]
[im 1/143]
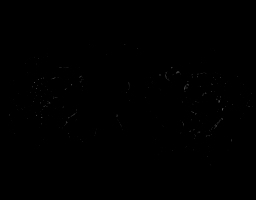
[im 48/143]
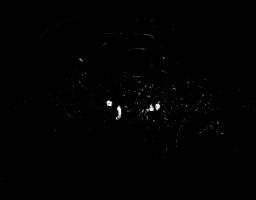
[im 95/143]
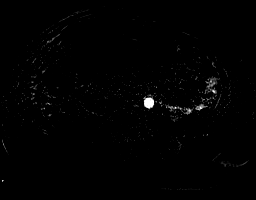
[im 143/143]
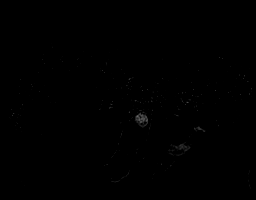

[Series 11: ax post 45 · axial · 2.3mm · 1.56mm/px · z∈[-124,+204]mm · 4 of 144 slices shown (1 of 2)]
[im 1/144]
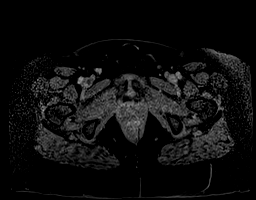
[im 48/144]
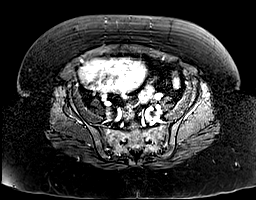
[im 96/144]
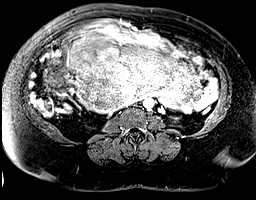
[im 144/144]
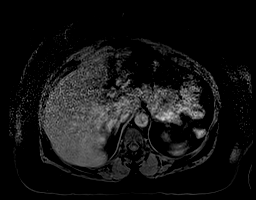

[Series 12: ax post 45 · axial · 2.3mm · 1.56mm/px · z∈[-124,+204]mm · 4 of 144 slices shown (2 of 2)]
[im 1/144]
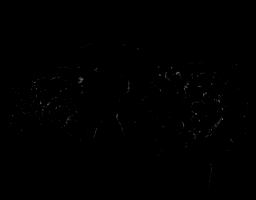
[im 48/144]
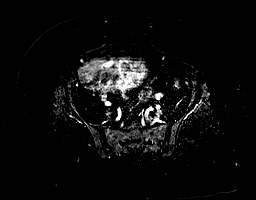
[im 96/144]
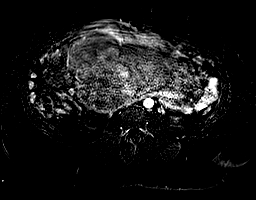
[im 144/144]
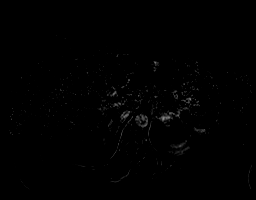

[Series 13: ax post 90 · axial · 2.3mm · 1.56mm/px · z∈[-124,+204]mm · 4 of 144 slices shown (1 of 2)]
[im 1/144]
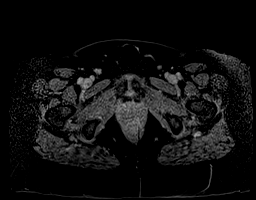
[im 48/144]
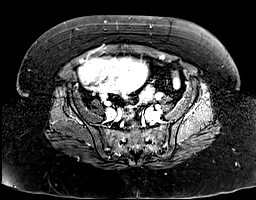
[im 96/144]
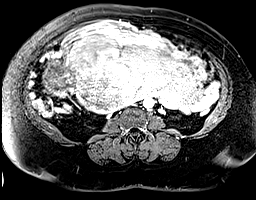
[im 144/144]
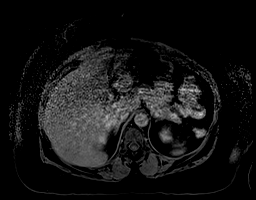

[Series 14: ax post 90 · axial · 2.3mm · 1.56mm/px · z∈[-124,+204]mm · 4 of 144 slices shown (2 of 2)]
[im 1/144]
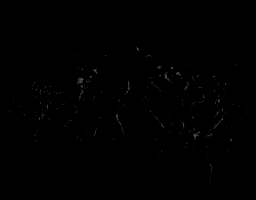
[im 48/144]
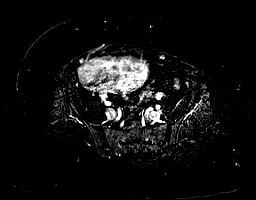
[im 96/144]
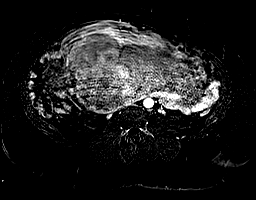
[im 144/144]
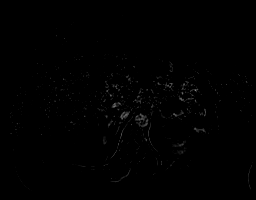

[Series 15: T1 dynamic post-contrast · sagittal · 3.5mm · 0.68mm/px · 2 of 96 slices shown]
[im 1/96]
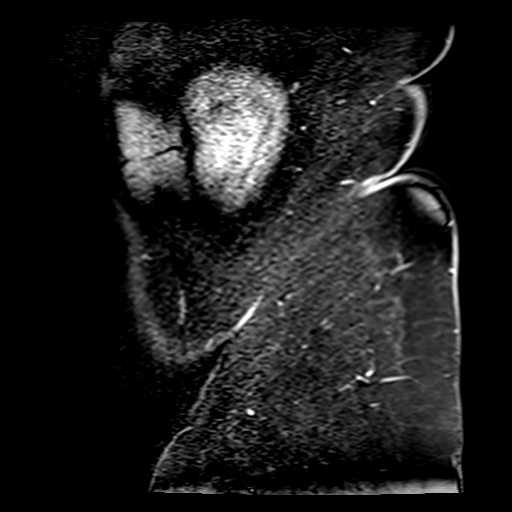
[im 96/96]
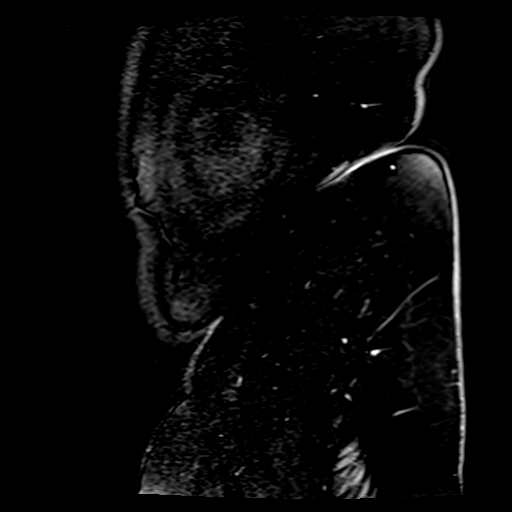

[Series 16: ax post 3+ · axial · 2.3mm · 1.56mm/px · z∈[-124,+204]mm · 4 of 144 slices shown (1 of 2)]
[im 1/144]
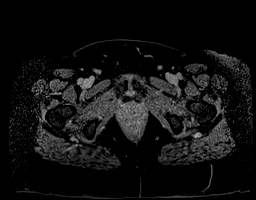
[im 48/144]
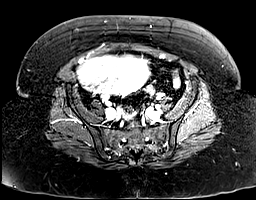
[im 96/144]
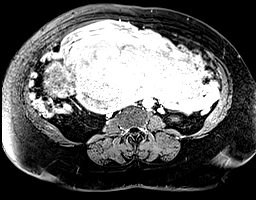
[im 144/144]
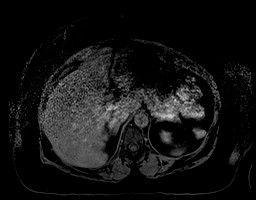

[Series 17: ax post 3+ · axial · 2.3mm · 1.56mm/px · z∈[-124,+204]mm · 4 of 144 slices shown (2 of 2)]
[im 1/144]
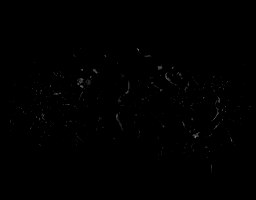
[im 48/144]
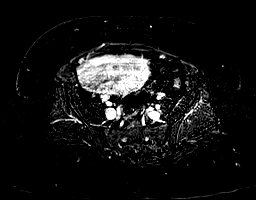
[im 96/144]
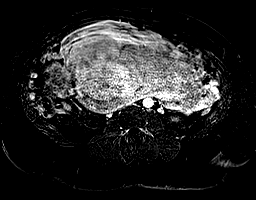
[im 144/144]
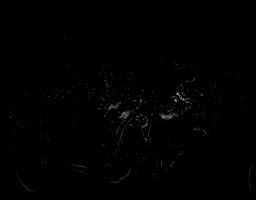

[Series 18: t2_tse_sag · sagittal · 5.0mm · 1.41mm/px · 1 of 56 slices shown (2 of 2)]
[im 1/56]
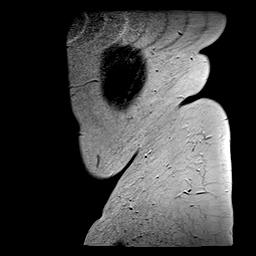

[48 of 48 positions shown; findings below may reference images not displayed]

FINDINGS: COMBINED FINDINGS FOR BOTH MR ABDOMEN AND PELVIS

Lower chest: No acute findings.

Hepatobiliary: Hepatic steatosis, somewhat geographic in the right
lobe. No mass or other parenchymal abnormality identified. No
gallstones. No biliary ductal dilatation.

Pancreas: No mass, inflammatory changes, or other parenchymal
abnormality identified. No pancreatic ductal dilatation.

Spleen:  Within normal limits in size and appearance.

Adrenals/Urinary Tract: No masses identified. No evidence of
hydronephrosis.

Stomach/Bowel: Visualized portions within the abdomen are
unremarkable.

Vascular/Lymphatic: No pathologically enlarged lymph nodes
identified. No abdominal aortic aneurysm demonstrated.

Reproductive: Extremely bulky fibroid uterus, with numerous fibroids
that grossly distort and efface the endometrial cavity. The overall
dimensions of the uterus are at least 26.0 x 25.9 x 14.3 cm. The
largest fibroid is an exophytic or pedunculated fibroid of the
posterior uterine body measuring at least 21.2 x 19.6 x 14.3 cm,
which extends into the upper abdomen.

Other:  None.

Musculoskeletal: No suspicious bone lesions identified.
IMPRESSION: 1. Extremely bulky fibroid uterus, with numerous fibroids that
grossly distort and efface the endometrial cavity. The overall
dimensions of the uterus are at least 26.0 x 25.9 x 14.3 cm.
2. The largest fibroid is an exophytic or pedunculated fibroid of
the posterior uterine body measuring at least 21.2 x 19.6 x 14.3 cm,
which extends into the upper abdomen.
3. Hepatic steatosis.

## 2021-01-26 MED ORDER — GADOBENATE DIMEGLUMINE 529 MG/ML IV SOLN
20.0000 mL | Freq: Once | INTRAVENOUS | Status: AC | PRN
  Administered 2021-01-26: 20 mL via INTRAVENOUS

## 2021-02-17 ENCOUNTER — Encounter: Payer: Self-pay | Admitting: Internal Medicine

## 2021-03-30 ENCOUNTER — Other Ambulatory Visit: Payer: Self-pay

## 2021-03-30 ENCOUNTER — Ambulatory Visit: Payer: 59 | Admitting: Cardiology

## 2021-03-30 ENCOUNTER — Encounter: Payer: Self-pay | Admitting: Cardiology

## 2021-03-30 VITALS — BP 144/110 | HR 106 | Ht 63.0 in | Wt 216.0 lb

## 2021-03-30 DIAGNOSIS — I10 Essential (primary) hypertension: Secondary | ICD-10-CM

## 2021-03-30 DIAGNOSIS — I517 Cardiomegaly: Secondary | ICD-10-CM | POA: Diagnosis not present

## 2021-03-30 DIAGNOSIS — E669 Obesity, unspecified: Secondary | ICD-10-CM | POA: Diagnosis not present

## 2021-03-30 MED ORDER — VALSARTAN 40 MG PO TABS: 40.0000 mg | ORAL_TABLET | Freq: Every day | ORAL | 3 refills | Status: AC

## 2021-03-30 NOTE — Patient Instructions (Addendum)
Medication Instructions:  Your physician has recommended you make the following change in your medication:  START: Valsartan 40 mg once daily   *If you need a refill on your cardiac medications before your next appointment, please call your pharmacy*   Lab Work: None If you have labs (blood work) drawn today and your tests are completely normal, you will receive your results only by: [place] (if you have MyChart) OR A paper copy in the mail If you have any lab test that is abnormal or we need to change your treatment, we will call you to review the results.   Testing/Procedures: Your physician has requested that you have an echocardiogram. Echocardiography is a painless test that uses sound waves to create images of your heart. It provides your doctor with information about the size and shape of your heart and how well your heart's chambers and valves are working. This procedure takes approximately one hour. There are no restrictions for this procedure.    Follow-Up: At [facility], you and your health needs are our priority.  As part of our continuing mission to provide you with exceptional heart care, we have created designated Provider Care Teams.  These Care Teams include your primary Cardiologist (physician) and Advanced Practice Providers (APPs -  Physician Assistants and Nurse Practitioners) who all work together to provide you with the care you need, when you need it.  We recommend signing up for the patient portal called "MyChart".  Sign up information is provided on this After Visit Summary.  MyChart is used to connect with patients for Virtual Visits (Telemedicine).  Patients are able to view lab/test results, encounter notes, upcoming appointments, etc.  Non-urgent messages can be sent to your provider as well.   To learn more about what you can do with MyChart, go to [email or web address].    Your next appointment:   3 month(s)  The format for your next  appointment:   In Person  Provider:   Berniece Salines, DO     Other Instructions  Echocardiogram An echocardiogram is a test that uses sound waves (ultrasound) to produce images of the heart. Images from an echocardiogram can provide important information about: Heart size and shape. The size and thickness and movement of your heart's walls. Heart muscle function and strength. Heart valve function or if you have stenosis. Stenosis is when the heart valves are too narrow. If blood is flowing backward through the heart valves (regurgitation). A tumor or infectious growth around the heart valves. Areas of heart muscle that are not working well because of poor blood flow or injury from a heart attack. Aneurysm detection. An aneurysm is a weak or damaged part of an artery wall. The wall bulges out from the normal force of blood pumping through the body. Tell a health care provider about: Any allergies you have. All medicines you are taking, including vitamins, herbs, eye drops, creams, and over-the-counter medicines. Any blood disorders you have. Any surgeries you have had. Any medical conditions you have. Whether you are pregnant or may be pregnant. What are the risks? Generally, this is a safe test. However, problems may occur, including an allergic reaction to dye (contrast) that may be used during the test. What happens before the test? No specific preparation is needed. You may eat and drink normally. What happens during the test?  You will take off your clothes from the waist up and put on a hospital gown. Electrodes or electrocardiogram (ECG)patches may be placed on  your chest. The electrodes or patches are then connected to a device that monitors your heart rate and rhythm. You will lie down on a table for an ultrasound exam. A gel will be applied to your chest to help sound waves pass through your skin. A handheld device, called a transducer, will be pressed against your chest and  moved over your heart. The transducer produces sound waves that travel to your heart and bounce back (or "echo" back) to the transducer. These sound waves will be captured in real-time and changed into images of your heart that can be viewed on a video monitor. The images will be recorded on a computer and reviewed by your health care provider. You may be asked to change positions or hold your breath for a short time. This makes it easier to get different views or better views of your heart. In some cases, you may receive contrast through an IV in one of your veins. This can improve the quality of the pictures from your heart. The procedure may vary among health care providers and hospitals. What can I expect after the test? You may return to your normal, everyday life, including diet, activities, and medicines, unless your health care provider tells you not to do that. Follow these instructions at home: It is up to you to get the results of your test. Ask your health care provider, or the department that is doing the test, when your results will be ready. Keep all follow-up visits. This is important. Summary An echocardiogram is a test that uses sound waves (ultrasound) to produce images of the heart. Images from an echocardiogram can provide important information about the size and shape of your heart, heart muscle function, heart valve function, and other possible heart problems. You do not need to do anything to prepare before this test. You may eat and drink normally. After the echocardiogram is completed, you may return to your normal, everyday life, unless your health care provider tells you not to do that. This information is not intended to replace advice given to you by your health care provider. Make sure you discuss any questions you have with your health care provider. Document Revised: 12/28/2020 Document Reviewed: 12/08/2019 [employer]vier Patient Education  2022 [employer].

## 2021-03-30 NOTE — Progress Notes (Signed)
Cardiology Office Note:    Date:  03/30/2021   ID:  Dayton Bailiff, DOB 1965-06-20, MRN [id number]  PCP:  Vonna Drafts, FNP  Cardiologist:  Berniece Salines, DO  Electrophysiologist:  None   Referring MD: Rowland Lathe, MD   Chief Complaint  Patient presents with   New Patient (Initial Visit)         History of Present Illness:    Jane Nunez is a 55 y.o. female with a hx of hypertension here today to be evaluated for hypertension as well as concern for abnormal EKG.  The patient notes that she was diagnosed with hypertension many years ago at which time she told antihypertensive medication.  At some point her PCP took her off the blood pressure medication because her blood pressure was under control.  Recently though she has had elevated blood pressure and she has been placed back on hydrochlorothiazide 12.5 mg daily.  What prompted her visit was the fact that she has significant fibroids and they are thinking about performing a procedure and she was asked to see cardiology for clearance. She is here with her husband.  She denies any chest pain any shortness of breath lightheadedness or dizziness.  She is very worried about the upcoming plans.  Past Medical History:  Diagnosis Date   Hypertension     Past Surgical History:  Procedure Laterality Date   CESAREAN SECTION      Current Medications: Current Meds  Medication Sig   hydrochlorothiazide (MICROZIDE) 12.5 MG capsule Take 12.5 mg by mouth daily.   valsartan (DIOVAN) 40 MG tablet Take 1 tablet (40 mg total) by mouth daily.   [DISCONTINUED] aluminum sulfate-calcium acetate (DOMEBORO) packet Apply 1 packet topically 3 (three) times daily.   [DISCONTINUED] benzonatate (TESSALON) 100 MG capsule Take 1 capsule (100 mg total) by mouth every 8 (eight) hours.   [DISCONTINUED] guaiFENesin (MUCINEX) 600 MG 12 hr tablet Take 1 tablet (600 mg total) by mouth 2 (two) times daily.   [DISCONTINUED] terbinafine (LAMISIL) 250 MG  tablet TAKE 1 TABLET (250 MG TOTAL) BY MOUTH DAILY.     Allergies:   Patient has no known allergies.   Social History   Socioeconomic History   Marital status: Married    Spouse name: Not on file   Number of children: Not on file   Years of education: Not on file   Highest education level: Not on file  Occupational History   Not on file  Tobacco Use   Smoking status: Never   Smokeless tobacco: Not on file  Substance and Sexual Activity   Alcohol use: Yes    Comment: occasionally   Drug use: No   Sexual activity: Not on file  Other Topics Concern   Not on file  Social History Narrative   Not on file   Social Determinants of Health   Financial Resource Strain: Not on file  Food Insecurity: Not on file  Transportation Needs: Not on file  Physical Activity: Not on file  Stress: Not on file  Social Connections: Not on file     Family History: The patient's family history includes Aneurysm in her father; Diabetes in her mother; Hypertension in her daughter and mother.  ROS:   Review of Systems  Constitution: Negative for decreased appetite, fever and weight gain.  HENT: Negative for congestion, ear discharge, hoarse voice and sore throat.   Eyes: Negative for discharge, redness, vision loss in right eye and visual halos.  Cardiovascular: Negative for  chest pain, dyspnea on exertion, leg swelling, orthopnea and palpitations.  Respiratory: Negative for cough, hemoptysis, shortness of breath and snoring.   Endocrine: Negative for heat intolerance and polyphagia.  Hematologic/Lymphatic: Negative for bleeding problem. Does not bruise/bleed easily.  Skin: Negative for flushing, nail changes, rash and suspicious lesions.  Musculoskeletal: Negative for arthritis, joint pain, muscle cramps, myalgias, neck pain and stiffness.  Gastrointestinal: Negative for abdominal pain, bowel incontinence, diarrhea and excessive appetite.  Genitourinary: Negative for decreased libido, genital  sores and incomplete emptying.  Neurological: Negative for brief paralysis, focal weakness, headaches and loss of balance.  Psychiatric/Behavioral: Negative for altered mental status, depression and suicidal ideas.  Allergic/Immunologic: Negative for HIV exposure and persistent infections.    EKGs/Labs/Other Studies Reviewed:    The following studies were reviewed today:   EKG:  The ekg ordered today demonstrates sinus tachycardia, heart rate 106 bpm with P wave morphology suggesting left atrial enlargement and left ventricular hypertrophy by aVL criteria as well as Cornell criteria.  Recent Labs: No results found for requested labs within last 8760 hours.  Recent Lipid Panel No results found for: CHOL, TRIG, HDL, CHOLHDL, VLDL, LDLCALC, LDLDIRECT  Physical Exam:    VS:  BP (!) 144/110 (BP Location: Right Arm, Patient Position: Sitting, Cuff Size: Large)   Pulse (!) 106   Ht 5\' 3"  (1.6 m)   Wt 216 lb (98 kg)   BMI 38.26 kg/m     Wt Readings from Last 3 Encounters:  03/30/21 216 lb (98 kg)  02/22/14 214 lb (97.1 kg)     GEN: Well nourished, well developed in no acute distress HEENT: Normal NECK: No JVD; No carotid bruits LYMPHATICS: No lymphadenopathy CARDIAC: S1S2 noted,RRR, no murmurs, rubs, gallops RESPIRATORY:  Clear to auscultation without rales, wheezing or rhonchi  ABDOMEN: Soft, non-tender, non-distended, +bowel sounds, no guarding. EXTREMITIES: No edema, No cyanosis, no clubbing MUSCULOSKELETAL:  No deformity  SKIN: Warm and dry NEUROLOGIC:  Alert and oriented x 3, non-focal PSYCHIATRIC:  Normal affect, good insight  ASSESSMENT:    1. Left ventricular hypertrophy   2. Hypertension, unspecified type   3. Obesity (BMI 30-39.9)    PLAN:     She is hypertensive in the office today.  She is on hydrochlorothiazide 12.5 mg daily.  I do believe the patient would benefit from a second agent so I am going to add valsartan 40 mg daily. Her EKG is concerning due to  evidence of left ventricular hypertrophy and left atrial enlargement.  I am going to get an echocardiogram to make sure that there are no endorgan damage from a heart as a result of her hypertension.  Is also help me understand the structure and assess her LV function as well.  I reviewed her EKG her T wave inversion on nonspecific and actually related to her LVH.  In terms of her noncardiac surgery, the patient does not have any unstable cardiac conditions.  Upon evaluation today, she can achieve 4 METs or greater without anginal symptoms.  According to [facility] and AHA guidelines, she requires no further cardiac workup prior to her noncardiac surgery and should be at acceptable risk.  Our service is available as necessary in the perioperative period.  The patient understands the need to lose weight with diet and exercise. We have discussed specific strategies for this.  The patient is in agreement with the above plan. The patient left the office in stable condition.  The patient will follow up in 3 months or  sooner if needed.   Medication Adjustments/Labs and Tests Ordered: Current medicines are reviewed at length with the patient today.  Concerns regarding medicines are outlined above.  Orders Placed This Encounter  Procedures   EKG 12-Lead   ECHOCARDIOGRAM COMPLETE    Meds ordered this encounter  Medications   valsartan (DIOVAN) 40 MG tablet    Sig: Take 1 tablet (40 mg total) by mouth daily.    Dispense:  90 tablet    Refill:  3     Patient Instructions  Medication Instructions:  Your physician has recommended you make the following change in your medication:  START: Valsartan 40 mg once daily   *If you need a refill on your cardiac medications before your next appointment, please call your pharmacy*   Lab Work: None If you have labs (blood work) drawn today and your tests are completely normal, you will receive your results only by: [place] (if you have MyChart) OR A  paper copy in the mail If you have any lab test that is abnormal or we need to change your treatment, we will call you to review the results.   Testing/Procedures: Your physician has requested that you have an echocardiogram. Echocardiography is a painless test that uses sound waves to create images of your heart. It provides your doctor with information about the size and shape of your heart and how well your heart's chambers and valves are working. This procedure takes approximately one hour. There are no restrictions for this procedure.    Follow-Up: At [facility], you and your health needs are our priority.  As part of our continuing mission to provide you with exceptional heart care, we have created designated Provider Care Teams.  These Care Teams include your primary Cardiologist (physician) and Advanced Practice Providers (APPs -  Physician Assistants and Nurse Practitioners) who all work together to provide you with the care you need, when you need it.  We recommend signing up for the patient portal called "MyChart".  Sign up information is provided on this After Visit Summary.  MyChart is used to connect with patients for Virtual Visits (Telemedicine).  Patients are able to view lab/test results, encounter notes, upcoming appointments, etc.  Non-urgent messages can be sent to your provider as well.   To learn more about what you can do with MyChart, go to [email or web address].    Your next appointment:   3 month(s)  The format for your next appointment:   In Person  Provider:   Berniece Salines, DO     Other Instructions  Echocardiogram An echocardiogram is a test that uses sound waves (ultrasound) to produce images of the heart. Images from an echocardiogram can provide important information about: Heart size and shape. The size and thickness and movement of your heart's walls. Heart muscle function and strength. Heart valve function or if you have stenosis. Stenosis is  when the heart valves are too narrow. If blood is flowing backward through the heart valves (regurgitation). A tumor or infectious growth around the heart valves. Areas of heart muscle that are not working well because of poor blood flow or injury from a heart attack. Aneurysm detection. An aneurysm is a weak or damaged part of an artery wall. The wall bulges out from the normal force of blood pumping through the body. Tell a health care provider about: Any allergies you have. All medicines you are taking, including vitamins, herbs, eye drops, creams, and over-the-counter medicines. Any blood disorders you have.  Any surgeries you have had. Any medical conditions you have. Whether you are pregnant or may be pregnant. What are the risks? Generally, this is a safe test. However, problems may occur, including an allergic reaction to dye (contrast) that may be used during the test. What happens before the test? No specific preparation is needed. You may eat and drink normally. What happens during the test?  You will take off your clothes from the waist up and put on a hospital gown. Electrodes or electrocardiogram (ECG)patches may be placed on your chest. The electrodes or patches are then connected to a device that monitors your heart rate and rhythm. You will lie down on a table for an ultrasound exam. A gel will be applied to your chest to help sound waves pass through your skin. A handheld device, called a transducer, will be pressed against your chest and moved over your heart. The transducer produces sound waves that travel to your heart and bounce back (or "echo" back) to the transducer. These sound waves will be captured in real-time and changed into images of your heart that can be viewed on a video monitor. The images will be recorded on a computer and reviewed by your health care provider. You may be asked to change positions or hold your breath for a short time. This makes it easier to  get different views or better views of your heart. In some cases, you may receive contrast through an IV in one of your veins. This can improve the quality of the pictures from your heart. The procedure may vary among health care providers and hospitals. What can I expect after the test? You may return to your normal, everyday life, including diet, activities, and medicines, unless your health care provider tells you not to do that. Follow these instructions at home: It is up to you to get the results of your test. Ask your health care provider, or the department that is doing the test, when your results will be ready. Keep all follow-up visits. This is important. Summary An echocardiogram is a test that uses sound waves (ultrasound) to produce images of the heart. Images from an echocardiogram can provide important information about the size and shape of your heart, heart muscle function, heart valve function, and other possible heart problems. You do not need to do anything to prepare before this test. You may eat and drink normally. After the echocardiogram is completed, you may return to your normal, everyday life, unless your health care provider tells you not to do that. This information is not intended to replace advice given to you by your health care provider. Make sure you discuss any questions you have with your health care provider. Document Revised: 12/28/2020 Document Reviewed: 12/08/2019 Elsevier Patient Education  2022 [place].   Adopting a Healthy Lifestyle.  Know what a healthy weight is for you (roughly BMI <25) and aim to maintain this   Aim for 7+ servings of fruits and vegetables daily   65-80+ fluid ounces of water or unsweet tea for healthy kidneys   Limit to max 1 drink of alcohol per day; avoid smoking/tobacco   Limit animal fats in diet for cholesterol and heart health - choose grass fed whenever available   Avoid highly processed foods, and foods high in  saturated/trans fats   Aim for low stress - take time to unwind and care for your mental health   Aim for 150 min of moderate intensity exercise weekly for  heart health, and weights twice weekly for bone health   Aim for 7-9 hours of sleep daily   When it comes to diets, agreement about the perfect plan isnt easy to find, even among the experts. Experts at the [employer] developed an idea known as the Healthy Eating Plate. Just imagine a plate divided into logical, healthy portions.   The emphasis is on diet quality:   Load up on vegetables and fruits - one-half of your plate: Aim for color and variety, and remember that potatoes dont count.   Go for whole grains - one-quarter of your plate: Whole wheat, barley, wheat berries, quinoa, oats, brown rice, and foods made with them. If you want pasta, go with whole wheat pasta.   Protein power - one-quarter of your plate: Fish, chicken, beans, and nuts are all healthy, versatile protein sources. Limit red meat.   The diet, however, does go beyond the plate, offering a few other suggestions.   Use healthy plant oils, such as olive, canola, soy, corn, sunflower and peanut. Check the labels, and avoid partially hydrogenated oil, which have unhealthy trans fats.   If youre thirsty, drink water. Coffee and tea are good in moderation, but skip sugary drinks and limit milk and dairy products to one or two daily servings.   The type of carbohydrate in the diet is more important than the amount. Some sources of carbohydrates, such as vegetables, fruits, whole grains, and beans-are healthier than others.   Finally, stay active  Signed, Berniece Salines, DO  03/30/2021 8:45 PM    [facility]

## 2021-04-07 ENCOUNTER — Ambulatory Visit (AMBULATORY_SURGERY_CENTER): Payer: 59

## 2021-04-07 ENCOUNTER — Other Ambulatory Visit: Payer: Self-pay

## 2021-04-07 ENCOUNTER — Encounter: Payer: Self-pay | Admitting: Internal Medicine

## 2021-04-07 VITALS — Ht 63.0 in | Wt 216.0 lb

## 2021-04-07 DIAGNOSIS — Z1211 Encounter for screening for malignant neoplasm of colon: Secondary | ICD-10-CM

## 2021-04-07 MED ORDER — NA SULFATE-K SULFATE-MG SULF 17.5-3.13-1.6 GM/177ML PO SOLN: 1.0000 | Freq: Once | ORAL | 0 refills | Status: AC

## 2021-04-07 NOTE — Progress Notes (Signed)
   Patient's pre-visit was done today over the phone with the patient   Name,DOB and address verified.   Patient denies any allergies to Eggs and Soy.  Patient denies any problems with anesthesia/sedation. Patient denies taking diet pills or blood thinners.  Denies atrial flutter or atrial fib Denies chronic constipation No home Oxygen.   Packet of Prep instructions mailed to patient including a copy of a consent form-pt is aware.  Patient understands to call us back with any questions or concerns.  Patient is aware of our care-partner policy and [id number]-71 safety protocol.    Pt is 5'3 per her statement  Pt denies chronic constipation, only uses miralax if needed.  Is going to the br daily

## 2021-04-17 ENCOUNTER — Other Ambulatory Visit: Payer: Self-pay

## 2021-04-17 ENCOUNTER — Ambulatory Visit (HOSPITAL_COMMUNITY): Payer: 59 | Attending: Cardiology

## 2021-04-17 DIAGNOSIS — I517 Cardiomegaly: Secondary | ICD-10-CM | POA: Diagnosis present

## 2021-04-17 LAB — ECHOCARDIOGRAM COMPLETE
Area-P 1/2: 5.62 cm2
S' Lateral: 2.7 cm

## 2021-04-17 MED ORDER — PERFLUTREN LIPID MICROSPHERE
1.0000 mL | INTRAVENOUS | Status: AC | PRN
Start: 2021-04-17 — End: 2021-04-17
  Administered 2021-04-17: 3 mL via INTRAVENOUS

## 2021-04-21 ENCOUNTER — Ambulatory Visit (AMBULATORY_SURGERY_CENTER): Payer: 59 | Admitting: Internal Medicine

## 2021-04-21 ENCOUNTER — Encounter: Payer: Self-pay | Admitting: Internal Medicine

## 2021-04-21 VITALS — BP 105/70 | HR 69 | Temp 97.7°F | Resp 22 | Ht 66.0 in | Wt 216.0 lb

## 2021-04-21 DIAGNOSIS — Z1211 Encounter for screening for malignant neoplasm of colon: Secondary | ICD-10-CM

## 2021-04-21 DIAGNOSIS — K621 Rectal polyp: Secondary | ICD-10-CM

## 2021-04-21 DIAGNOSIS — D123 Benign neoplasm of transverse colon: Secondary | ICD-10-CM

## 2021-04-21 DIAGNOSIS — D128 Benign neoplasm of rectum: Secondary | ICD-10-CM

## 2021-04-21 DIAGNOSIS — K6289 Other specified diseases of anus and rectum: Secondary | ICD-10-CM

## 2021-04-21 DIAGNOSIS — K635 Polyp of colon: Secondary | ICD-10-CM | POA: Diagnosis not present

## 2021-04-21 DIAGNOSIS — D124 Benign neoplasm of descending colon: Secondary | ICD-10-CM

## 2021-04-21 MED ORDER — SODIUM CHLORIDE 0.9 % IV SOLN: 500.0000 mL | INTRAVENOUS | Status: DC

## 2021-04-21 NOTE — Patient Instructions (Signed)
Discharge instructions given. Handouts on polyps,diverticulosis and hemorrhoids. Resume previous medications. YOU HAD AN ENDOSCOPIC PROCEDURE TODAY AT [facility]:   Refer to the procedure report that was given to you for any specific questions about what was found during the examination.  If the procedure report does not answer your questions, please call your gastroenterologist to clarify.  If you requested that your care partner not be given the details of your procedure findings, then the procedure report has been included in a sealed envelope for you to review at your convenience later.  YOU SHOULD EXPECT: Some feelings of bloating in the abdomen. Passage of more gas than usual.  Walking can help get rid of the air that was put into your GI tract during the procedure and reduce the bloating. If you had a lower endoscopy (such as a colonoscopy or flexible sigmoidoscopy) you may notice spotting of blood in your stool or on the toilet paper. If you underwent a bowel prep for your procedure, you may not have a normal bowel movement for a few days.  Please Note:  You might notice some irritation and congestion in your nose or some drainage.  This is from the oxygen used during your procedure.  There is no need for concern and it should clear up in a day or so.  SYMPTOMS TO REPORT IMMEDIATELY:  Following lower endoscopy (colonoscopy or flexible sigmoidoscopy):  Excessive amounts of blood in the stool  Significant tenderness or worsening of abdominal pains  Swelling of the abdomen that is new, acute  Fever of 100F or higher   For urgent or emergent issues, a gastroenterologist can be reached at any hour by calling [phone]. Do not use MyChart messaging for urgent concerns.    DIET:  We do recommend a small meal at first, but then you may proceed to your regular diet.  Drink plenty of fluids but you should avoid alcoholic beverages for 24 hours.  ACTIVITY:  You should  plan to take it easy for the rest of today and you should NOT DRIVE or use heavy machinery until tomorrow (because of the sedation medicines used during the test).    FOLLOW UP: Our staff will call the number listed on your records 48-72 hours following your procedure to check on you and address any questions or concerns that you may have regarding the information given to you following your procedure. If we do not reach you, we will leave a message.  We will attempt to reach you two times.  During this call, we will ask if you have developed any symptoms of COVID 19. If you develop any symptoms (ie: fever, flu-like symptoms, shortness of breath, cough etc.) before then, please call [phone].  If you test positive for Covid 19 in the 2 weeks post procedure, please call and report this information to [place].    If any biopsies were taken you will be contacted by phone or by letter within the next 1-3 weeks.  Please call us at [phone] if you have not heard about the biopsies in 3 weeks.    SIGNATURES/CONFIDENTIALITY: You and/or your care partner have signed paperwork which will be entered into your electronic medical record.  These signatures attest to the fact that that the information above on your After Visit Summary has been reviewed and is understood.  Full responsibility of the confidentiality of this discharge information lies with you and/or your care-partner.

## 2021-04-21 NOTE — Progress Notes (Signed)
Called to room to assist during endoscopic procedure.  Patient ID and intended procedure confirmed with present staff. Received instructions for my participation in the procedure from the performing physician.  

## 2021-04-21 NOTE — Progress Notes (Signed)
GASTROENTEROLOGY PROCEDURE H&P NOTE   Primary Care Physician: Vonna Drafts, FNP    Reason for Procedure:   Colon cancer screening  Plan:    Colonoscopy  Patient is appropriate for endoscopic procedure(s) in the ambulatory ([place]) setting.  The nature of the procedure, as well as the risks, benefits, and alternatives were carefully and thoroughly reviewed with the patient. Ample time for discussion and questions allowed. The patient understood, was satisfied, and agreed to proceed.     HPI: Jane Nunez is a 55 y.o. female who presents for colonoscopy for colon cancer screening. This her first colonoscopy. Denies blood in stools, changes in bowel habits, weight loss. Denies fam hx of colon cancer.  Past Medical History:  Diagnosis Date   Hyperlipidemia    Hypertension    Thyroid disease    tumor and oversized-has surgery planned.  No cancer    Past Surgical History:  Procedure Laterality Date   CESAREAN SECTION     DOPPLER ECHOCARDIOGRAPHY     04-17-2021  EF 55-60%, no AVS    Prior to Admission medications   Medication Sig Start Date End Date Taking? Authorizing Provider  hydrochlorothiazide (MICROZIDE) 12.5 MG capsule Take 12.5 mg by mouth daily.   Yes [provider]  valsartan (DIOVAN) 40 MG tablet Take 1 tablet (40 mg total) by mouth daily. 03/30/21  Yes Tobb, Kardie, DO    Current Outpatient Medications  Medication Sig Dispense Refill   hydrochlorothiazide (MICROZIDE) 12.5 MG capsule Take 12.5 mg by mouth daily.     valsartan (DIOVAN) 40 MG tablet Take 1 tablet (40 mg total) by mouth daily. 90 tablet 3   Current Facility-Administered Medications  Medication Dose Route Frequency Provider Last Rate Last Admin   0.9 %  sodium chloride infusion  500 mL Intravenous Continuous Sharyn Creamer, MD        Allergies as of 04/21/2021   (No Known Allergies)    Family History  Problem Relation Age of Onset   Hypertension Mother    Diabetes Mother     Aneurysm Father    Esophageal cancer Sister    Hypertension Daughter    Colon cancer Neg Hx    Colon polyps Neg Hx    Stomach cancer Neg Hx    Rectal cancer Neg Hx     Social History   Socioeconomic History   Marital status: Married    Spouse name: Not on file   Number of children: Not on file   Years of education: Not on file   Highest education level: Not on file  Occupational History   Not on file  Tobacco Use   Smoking status: Never   Smokeless tobacco: Never  Vaping Use   Vaping Use: Never used  Substance and Sexual Activity   Alcohol use: Yes    Comment: occasionally   Drug use: No   Sexual activity: Not on file  Other Topics Concern   Not on file  Social History Narrative   Not on file   Social Determinants of Health   Financial Resource Strain: Not on file  Food Insecurity: Not on file  Transportation Needs: Not on file  Physical Activity: Not on file  Stress: Not on file  Social Connections: Not on file  Intimate Partner Violence: Not on file    Physical Exam: Vital signs in last 24 hours: BP (!) 142/94    Pulse 83    Temp 97.7 F (36.5 C)    Ht 5'  6" (1.676 m)    Wt 216 lb (98 kg)    SpO2 96%    BMI 34.86 kg/m  GEN: NAD EYE: Sclerae anicteric ENT: MMM CV: Non-tachycardic Pulm: No increased work of breathing GI: Soft, NT/ND NEURO:  Alert & Oriented   Christia Reading, MD [facility]  04/21/2021 8:11 AM

## 2021-04-21 NOTE — Op Note (Signed)
[place] Patient Name: Jane Nunez Procedure Date: 04/21/2021 8:33 AM MRN: [id number] Endoscopist: Sonny Masters "Jane Nunez ,  Age: 55 Referring MD:  Date of Birth: 1966-01-15 Gender: Female Account #: [id number] Procedure:                Colonoscopy Indications:              Screening for colorectal malignant neoplasm, This                            is the patient's first colonoscopy Medicines:                Monitored Anesthesia Care Procedure:                Pre-Anesthesia Assessment:                           - Prior to the procedure, a History and Physical                            was performed, and patient medications and                            allergies were reviewed. The patient's tolerance of                            previous anesthesia was also reviewed. The risks                            and benefits of the procedure and the sedation                            options and risks were discussed with the patient.                            All questions were answered, and informed consent                            was obtained. Prior Anticoagulants: The patient has                            taken no previous anticoagulant or antiplatelet                            agents. ASA Grade Assessment: II - A patient with                            mild systemic disease. After reviewing the risks                            and benefits, the patient was deemed in                            satisfactory condition to undergo the procedure.  After obtaining informed consent, the colonoscope                            was passed under direct vision. Throughout the                            procedure, the patient's blood pressure, pulse, and                            oxygen saturations were monitored continuously. The                            Olympus CF-HQ190L [id number]) Colonoscope was                            introduced through the  anus and advanced to the the                            terminal ileum. The colonoscopy was performed                            without difficulty. The patient tolerated the                            procedure well. The quality of the bowel                            preparation was good. The terminal ileum, ileocecal                            valve, appendiceal orifice, and rectum were                            photographed. Scope In: 8:47:10 AM Scope Out: 9:04:44 AM Scope Withdrawal Time: 0 hours 14 minutes 28 seconds  Total Procedure Duration: 0 hours 17 minutes 34 seconds  Findings:                 The terminal ileum appeared normal.                           Three sessile polyps were found in the rectum,                            sigmoid colon and transverse colon. The polyps were                            2 to 5 mm in size. These polyps were removed with a                            cold snare. Resection and retrieval were complete.                           Scattered small and large-mouthed diverticula were  found in the entire colon.                           Non-bleeding internal hemorrhoids were found during                            retroflexion.                           No additional abnormalities were found on                            retroflexion.                           Anal papilla(e) were hypertrophied. Biopsies were                            taken with a cold forceps for histology. Complications:            No immediate complications. Estimated Blood Loss:     Estimated blood loss was minimal. Impression:               - The examined portion of the ileum was normal.                           - Three 2 to 5 mm polyps in the rectum, in the                            sigmoid colon and in the transverse colon, removed                            with a cold snare. Resected and retrieved.                           - Diverticulosis in  the entire examined colon.                           - Non-bleeding internal hemorrhoids.                           - Anal papilla(e) were hypertrophied. Biopsied. Recommendation:           - Discharge patient to home (with escort).                           - Await pathology results.                           - The findings and recommendations were discussed                            with the patient. Sonny Masters "Jane Nunez,  04/21/2021 9:11:18 AM

## 2021-04-21 NOTE — Progress Notes (Signed)
Report to PACU, RN, vss, BBS= Clear.  

## 2021-04-26 ENCOUNTER — Telehealth: Payer: Self-pay | Admitting: *Deleted

## 2021-04-26 NOTE — Telephone Encounter (Signed)
°  Follow up Call-  Call back number 04/21/2021  Post procedure Call Back phone  # [phone]  Permission to leave phone message Yes  Some recent data might be hidden     Patient questions:  Do you have a fever, pain , or abdominal swelling? No. Pain Score  0 *  Have you tolerated food without any problems? Yes.    Have you been able to return to your normal activities? Yes.    Do you have any questions about your discharge instructions: Diet   No. Medications  No. Follow up visit  No.  Do you have questions or concerns about your Care? No.  Actions: * If pain score is 4 or above: No action needed, pain <4.

## 2021-04-28 ENCOUNTER — Encounter: Payer: Self-pay | Admitting: Internal Medicine

## 2021-05-17 NOTE — Progress Notes (Signed)
Surgical Instructions    Your procedure is scheduled on Monday, January 23rd, 2023.   Report to [facility] Main Entrance "A" at 05:30 A.M., then check in with the Admitting office.  Call this number if you have problems the morning of surgery:  [phone]   If you have any questions prior to your surgery date call [phone]: Open Monday-Friday 8am-4pm    Remember:  Do not eat after midnight the night before your surgery  You may drink clear liquids until 04:30 the morning of your surgery.   Clear liquids allowed are: Water, Non-Citrus Juices (without pulp), Carbonated Beverages, Clear Tea, Black Coffee ONLY (NO MILK, CREAM OR POWDERED CREAMER of any kind), and Gatorade    Take these medicines the morning of surgery with A SIP OF WATER:  acetaminophen (TYLENOL) - as needed    As of today, STOP taking any Aspirin (unless otherwise instructed by your surgeon) Aleve, Naproxen, Ibuprofen, Motrin, Advil, Goody's, BC's, all herbal medications, fish oil, and all vitamins.  After your COVID test   You are not required to quarantine however you are required to wear a well-fitting mask when you are out and around people not in your household.  If your mask becomes wet or soiled, replace with a new one.  Wash your hands often with soap and water for 20 seconds or clean your hands with an alcohol-based hand sanitizer that contains at least 60% alcohol.  Do not share personal items.  Notify your provider: if you are in close contact with someone who has COVID  or if you develop a fever of 100.4 or greater, sneezing, cough, sore throat, shortness of breath or body aches.           Do not wear jewelry or makeup Do not wear lotions, powders, perfumes, or deodorant. Do not shave 48 hours prior to surgery.  . Do not bring valuables to the hospital. DO Not wear nail polish, gel polish, artificial nails, or any other type of covering on natural nails (fingers and toes) If you have  artificial nails or gel coating that need to be removed by a nail salon, please have this removed prior to surgery. Artificial nails or gel coating may interfere with anesthesia's ability to adequately monitor your vital signs.             [facility] is not responsible for any belongings or valuables.  Do NOT Smoke (Tobacco/Vaping)  24 hours prior to your procedure  If you use a CPAP at night, you may bring your mask for your overnight stay.   Contacts, glasses, hearing aids, dentures or partials may not be worn into surgery, please bring cases for these belongings   For patients admitted to the hospital, discharge time will be determined by your treatment team.   Patients discharged the day of surgery will not be allowed to drive home, and someone needs to stay with them for 24 hours.  NO VISITORS WILL BE ALLOWED IN PRE-OP WHERE PATIENTS ARE PREPPED FOR SURGERY.  ONLY 1 SUPPORT PERSON MAY BE PRESENT IN THE WAITING ROOM WHILE YOU ARE IN SURGERY.  IF YOU ARE TO BE ADMITTED, ONCE YOU ARE IN YOUR ROOM YOU WILL BE ALLOWED TWO (2) VISITORS. 1 (ONE) VISITOR MAY STAY OVERNIGHT BUT MUST ARRIVE TO THE ROOM BY 8pm.  Minor children may have two parents present. Special consideration for safety and communication needs will be reviewed on a case by case basis.  Special instructions:  Oral Hygiene is also important to reduce your risk of infection.  Remember - BRUSH YOUR TEETH THE MORNING OF SURGERY WITH YOUR REGULAR TOOTHPASTE   [facility]- Preparing For Surgery  Before surgery, you can play an important role. Because skin is not sterile, your skin needs to be as free of germs as possible. You can reduce the number of germs on your skin by washing with CHG (chlorahexidine gluconate) Soap before surgery.  CHG is an antiseptic cleaner which kills germs and bonds with the skin to continue killing germs even after washing.     Please do not use if you have an allergy to CHG or antibacterial soaps. If  your skin becomes reddened/irritated stop using the CHG.  Do not shave (including legs and underarms) for at least 48 hours prior to first CHG shower. It is OK to shave your face.  Please follow these instructions carefully.     Shower the NIGHT BEFORE SURGERY and the MORNING OF SURGERY with CHG Soap.   If you chose to wash your hair, wash your hair first as usual with your normal shampoo. After you shampoo, rinse your hair and body thoroughly to remove the shampoo.  Then [employer] and genitals (private parts) with your normal soap and rinse thoroughly to remove soap.  After that Use CHG Soap as you would any other liquid soap. You can apply CHG directly to the skin and wash gently with a scrungie or a clean washcloth.   Apply the CHG Soap to your body ONLY FROM THE NECK DOWN.  Do not use on open wounds or open sores. Avoid contact with your eyes, ears, mouth and genitals (private parts). Wash Face and genitals (private parts)  with your normal soap.   Wash thoroughly, paying special attention to the area where your surgery will be performed.  Thoroughly rinse your body with warm water from the neck down.  DO NOT shower/wash with your normal soap after using and rinsing off the CHG Soap.  Pat yourself dry with a CLEAN TOWEL.  Wear CLEAN PAJAMAS to bed the night before surgery  Place CLEAN SHEETS on your bed the night before your surgery  DO NOT SLEEP WITH PETS.   Day of Surgery:  Take a shower with CHG soap. Wear Clean/Comfortable clothing the morning of surgery Do not apply any deodorants/lotions.   Remember to brush your teeth WITH YOUR REGULAR TOOTHPASTE.   Please read over the following fact sheets that you were given.

## 2021-05-18 ENCOUNTER — Other Ambulatory Visit: Payer: Self-pay

## 2021-05-18 ENCOUNTER — Encounter (HOSPITAL_COMMUNITY): Payer: Self-pay

## 2021-05-18 ENCOUNTER — Encounter (HOSPITAL_COMMUNITY)
Admission: RE | Admit: 2021-05-18 | Discharge: 2021-05-18 | Disposition: A | Discharge status: Home / Self Care | Payer: 59 | Source: Ambulatory Visit | Attending: Obstetrics and Gynecology | Admitting: Obstetrics and Gynecology

## 2021-05-18 VITALS — BP 150/90 | HR 92 | Temp 98.2°F | Resp 18 | Ht 63.0 in | Wt 220.8 lb

## 2021-05-18 DIAGNOSIS — Z01812 Encounter for preprocedural laboratory examination: Secondary | ICD-10-CM | POA: Diagnosis present

## 2021-05-18 DIAGNOSIS — Z20822 Contact with and (suspected) exposure to covid-19: Secondary | ICD-10-CM | POA: Diagnosis not present

## 2021-05-18 DIAGNOSIS — I10 Essential (primary) hypertension: Secondary | ICD-10-CM | POA: Diagnosis not present

## 2021-05-18 DIAGNOSIS — E871 Hypo-osmolality and hyponatremia: Secondary | ICD-10-CM | POA: Insufficient documentation

## 2021-05-18 DIAGNOSIS — Z01818 Encounter for other preprocedural examination: Secondary | ICD-10-CM

## 2021-05-18 DIAGNOSIS — R9431 Abnormal electrocardiogram [ECG] [EKG]: Secondary | ICD-10-CM | POA: Insufficient documentation

## 2021-05-18 LAB — COMPREHENSIVE METABOLIC PANEL
ALT: 25 U/L (ref 0–44)
AST: 37 U/L (ref 15–41)
Albumin: 4.3 g/dL (ref 3.5–5.0)
Alkaline Phosphatase: 49 U/L (ref 38–126)
Anion gap: 8 (ref 5–15)
BUN: 11 mg/dL (ref 6–20)
CO2: 26 mmol/L (ref 22–32)
Calcium: 9.3 mg/dL (ref 8.9–10.3)
Chloride: 99 mmol/L (ref 98–111)
Creatinine, Ser: 0.8 mg/dL (ref 0.44–1.00)
GFR, Estimated: >60 mL/min (ref >60)
Glucose, Bld: 110 mg/dL — ABNORMAL HIGH (ref 70–99)
Potassium: 4 mmol/L (ref 3.5–5.1)
Sodium: 133 mmol/L — ABNORMAL LOW (ref 135–145)
Total Bilirubin: 0.9 mg/dL (ref 0.3–1.2)
Total Protein: 7.2 g/dL (ref 6.5–8.1)

## 2021-05-18 LAB — CBC WITH DIFFERENTIAL/PLATELET
Abs Immature Granulocytes: 0.01 10*3/uL (ref 0.00–0.07)
Basophils Absolute: 0 10*3/uL (ref 0.0–0.1)
Basophils Relative: 1 %
Eosinophils Absolute: 0.8 10*3/uL — ABNORMAL HIGH (ref 0.0–0.5)
Eosinophils Relative: 13 %
HCT: 44 % (ref 36.0–46.0)
Hemoglobin: 14.8 g/dL (ref 12.0–15.0)
Immature Granulocytes: 0 %
Lymphocytes Relative: 35 %
Lymphs Abs: 2.2 10*3/uL (ref 0.7–4.0)
MCH: 31.6 pg (ref 26.0–34.0)
MCHC: 33.6 g/dL (ref 30.0–36.0)
MCV: 93.8 fL (ref 80.0–100.0)
Monocytes Absolute: 0.6 10*3/uL (ref 0.1–1.0)
Monocytes Relative: 10 %
Neutro Abs: 2.6 10*3/uL (ref 1.7–7.7)
Neutrophils Relative %: 41 %
Platelets: 241 10*3/uL (ref 150–400)
RBC: 4.69 MIL/uL (ref 3.87–5.11)
RDW: 13.1 % (ref 11.5–15.5)
WBC: 6.3 10*3/uL (ref 4.0–10.5)
nRBC: 0 % (ref 0.0–0.2)

## 2021-05-18 LAB — SARS CORONAVIRUS 2 (TAT 6-24 HRS): SARS Coronavirus 2: NEGATIVE

## 2021-05-18 NOTE — Progress Notes (Signed)
PCP - Selina Cooley, FNP Cardiologist - Berniece Salines, DO  PPM/ICD - denies Device Orders - n/a Rep Notified - n/a  Chest x-ray - n/a EKG - 03/30/2021 Stress Test - denies ECHO - 04/17/2021 Cardiac Cath - denies  Sleep Study - denies CPAP - n/a  Fasting Blood Sugar - n/a  Blood Thinner Instructions: n/a  Aspirin Instructions: Patient was instructed: As of today, STOP taking any Aspirin (unless otherwise instructed by your surgeon) Aleve, Naproxen, Ibuprofen, Motrin, Advil, Goody's, BC's, all herbal medications, fish oil, and all vitamins.    ERAS Protcol - yes PRE-SURGERY Ensure or G2- no  COVID TEST- done in PAT on 05/18/2021   Anesthesia review: yes - cardiac clearance 03/30/2021  Patient denies shortness of breath, fever, cough and chest pain at PAT appointment   All instructions explained to the patient, with a verbal understanding of the material. Patient agrees to go over the instructions while at home for a better understanding. Patient also instructed to self quarantine after being tested for COVID-19. The opportunity to ask questions was provided.

## 2021-05-19 NOTE — Anesthesia Preprocedure Evaluation (Addendum)
Anesthesia Evaluation  Patient identified by MRN, date of birth, ID band Patient awake    Reviewed: Allergy & Precautions, NPO status , Patient's Chart, lab work & pertinent test results  Airway Mallampati: II  TM Distance: >3 FB Neck ROM: Full    Dental no notable dental hx. (+) Dental Advisory Given   Pulmonary neg pulmonary ROS,    Pulmonary exam normal breath sounds clear to auscultation       Cardiovascular hypertension, Pt. on medications Normal cardiovascular exam Rhythm:Regular Rate:Normal     Neuro/Psych negative neurological ROS     GI/Hepatic negative GI ROS, Neg liver ROS,   Endo/Other  Morbid obesity  Renal/GU negative Renal ROS     Musculoskeletal negative musculoskeletal ROS (+)   Abdominal (+) + obese,   Peds  Hematology negative hematology ROS (+)   Anesthesia Other Findings   Reproductive/Obstetrics                           Anesthesia Physical Anesthesia Plan  ASA: 3  Anesthesia Plan: General   Post-op Pain Management: Tylenol PO (pre-op), Regional block and Dilaudid IV   Induction: Intravenous  PONV Risk Score and Plan: 4 or greater and Ondansetron, Dexamethasone, Treatment may vary due to age or medical condition, Midazolam and Scopolamine patch - Pre-op  Airway Management Planned: Oral ETT  Additional Equipment: None  Intra-op Plan:   Post-operative Plan: Extubation in OR  Informed Consent: I have reviewed the patients History and Physical, chart, labs and discussed the procedure including the risks, benefits and alternatives for the proposed anesthesia with the patient or authorized representative who has indicated his/her understanding and acceptance.     Dental advisory given  Plan Discussed with: CRNA  Anesthesia Plan Comments: (PAT note by Karoline Caldwell, PA-C: Pt evaluated by cardiologist Dr. Harriet Masson 03/30/21 at the request of her PCP for HTN and  abnormal EKG as well as preop risk stratification. Per note, "In terms of her noncardiac surgery, the patient does not have any unstable cardiac conditions.  Upon evaluation today, she can achieve 4 METs or greater without anginal symptoms.  According to [facility] and AHA guidelines, she requires no further cardiac workup prior to her noncardiac surgery and should be at acceptable risk.  Our service is available as necessary in the perioperative period." Echocardiogram was also ordered, done 04/17/21, showed EF 55-60%, grade 1 dd.  Preop labs reviewed, mild hyponatremia with sodium 133, otherwise unremarkable.   EKG 03/30/21: Sinus tachycardia. Rate 106. Possible LAE. LVH. Nonspecific T wave abnormality.   TTE 04/17/21 1. Left ventricular ejection fraction, by estimation, is 55 to 60%. The  left ventricle has normal function. The left ventricle has no regional  wall motion abnormalities. Left ventricular diastolic parameters are  consistent with Grade I diastolic  dysfunction (impaired relaxation).  2. Right ventricular systolic function is normal. The right ventricular  size is normal. Tricuspid regurgitation signal is inadequate for assessing  PA pressure.  3. The mitral valve is normal in structure. Trivial mitral valve  regurgitation.  4. The aortic valve is tricuspid. There is mild thickening of the aortic  valve. Aortic valve regurgitation is trivial. Aortic valve sclerosis is  present, with no evidence of aortic valve stenosis.  5. The inferior vena cava is normal in size with greater than 50%  respiratory variability, suggesting right atrial pressure of 3 mmHg.   Comparison(s): No prior Echocardiogram. )      Anesthesia  Quick Evaluation

## 2021-05-19 NOTE — Progress Notes (Signed)
Anesthesia Chart Review:  Pt evaluated by cardiologist Dr. Harriet Masson 03/30/21 at the request of her PCP for HTN and abnormal EKG as well as preop risk stratification. Per note, "In terms of her noncardiac surgery, the patient does not have any unstable cardiac conditions.  Upon evaluation today, she can achieve 4 METs or greater without anginal symptoms.  According to [facility] and AHA guidelines, she requires no further cardiac workup prior to her noncardiac surgery and should be at acceptable risk.  Our service is available as necessary in the perioperative period." Echocardiogram was also ordered, done 04/17/21, showed EF 55-60%, grade 1 dd.  Preop labs reviewed, mild hyponatremia with sodium 133, otherwise unremarkable.   EKG 03/30/21: Sinus tachycardia. Rate 106. Possible LAE. LVH. Nonspecific T wave abnormality.   TTE 04/17/21  1. Left ventricular ejection fraction, by estimation, is 55 to 60%. The  left ventricle has normal function. The left ventricle has no regional  wall motion abnormalities. Left ventricular diastolic parameters are  consistent with Grade I diastolic  dysfunction (impaired relaxation).   2. Right ventricular systolic function is normal. The right ventricular  size is normal. Tricuspid regurgitation signal is inadequate for assessing  PA pressure.   3. The mitral valve is normal in structure. Trivial mitral valve  regurgitation.   4. The aortic valve is tricuspid. There is mild thickening of the aortic  valve. Aortic valve regurgitation is trivial. Aortic valve sclerosis is  present, with no evidence of aortic valve stenosis.   5. The inferior vena cava is normal in size with greater than 50%  respiratory variability, suggesting right atrial pressure of 3 mmHg.   Comparison(s): No prior Echocardiogram.    Wynonia Musty [facility] Short Stay Center/Anesthesiology Phone [phone] 05/19/2021 10:56 AM

## 2021-05-21 NOTE — H&P (Signed)
Jane Nunez is an 56 y.o. female P1 with large 26cm fibroid uterus presenting for hysterectomy.   MRI, abdomen + pelvis, w/wo contrast 01-26-2021 MRI pelvis: bulky fibroid uterus 26 x 25.9 x 14.3cm, largest fibroid is pedunculated off the posterior uterus measuring 21.2 x 19.6 x 14.3cm  No LMP recorded. Patient is postmenopausal.  History of hypertension - has been cleared for surgery by cardiology.     Past Medical History:  Diagnosis Date   Hyperlipidemia    Hypertension    Thyroid disease    tumor and oversized-has surgery planned.  No cancer    Past Surgical History:  Procedure Laterality Date   CESAREAN SECTION     DOPPLER ECHOCARDIOGRAPHY     04-17-2021  EF 55-60%, no AVS    Family History  Problem Relation Age of Onset   Hypertension Mother    Diabetes Mother    Aneurysm Father    Esophageal cancer Sister    Hypertension Daughter    Colon cancer Neg Hx    Colon polyps Neg Hx    Stomach cancer Neg Hx    Rectal cancer Neg Hx     Social History:  reports that she has never smoked. She has never used smokeless tobacco. She reports current alcohol use. She reports that she does not use drugs.  Allergies: No Known Allergies  No medications prior to admission.    Review of Systems  Constitutional:  Negative for fever.  HENT:  Negative for sore throat.   Eyes:  Negative for pain.  Respiratory:  Negative for shortness of breath.   Cardiovascular:  Negative for chest pain.  Gastrointestinal:  Positive for abdominal distention.  Genitourinary:  Negative for vaginal bleeding.  Musculoskeletal:  Negative for myalgias.  Skin:  Negative for rash.  Neurological:  Negative for headaches.  Psychiatric/Behavioral:  Negative for suicidal ideas.    There were no vitals taken for this visit. Physical Exam   Chaperone Chaperone: present  Constitutional *General Appearance: healthy-appearing, well-nourished, well-developed  Head Head:  normocephalic  Neck *Thyroid: no enlargement, no nodules, non-tender  Lymph Nodes *Palpation: non-tender supraclavicular nodes, non-tender axillary nodes, non-tender inguinal nodes  Cardiovascular *Auscultation: RRR, no murmur  Lungs *Respiratory Effort: no accessory muscle usage, no intercostal retractions *Auscultation: clear to auscultation, no wheezing, no rales/crackles, no rhonchi  Abdomen *Inspection/Palpation/Auscultation: no tenderness, no rebound, no guarding (large uterine fibroid extending to upper abdomen)  Female Genitalia Vulva: no masses, no atrophy, no lesions Mons: normal Labia Majora: normal, no erythema, no excoriation, no atrophy, no discoloration, no lesions Labia Minora: normal, no erythema, no excoriation, no atrophy, no discoloration, no lesions Introitus: normal *Vagina: normal, no discharge, no blood present, no erythema, no atrophy, no lesions, no ulcers, no masses, no tenderness *Cervix: grossly normal, no lesions, no discharge, no bleeding, no cervical motion tenderness *Uterus: midline, contour irregular, fibroids, enlarged 26 weeks size *Urethral Meatus/ Urethra: normal meatus *Adnexa/Parametria: no mass palpable, no tenderness  Extremities Legs: no calf tenderness  Neurological System Impressions: motor: no deficits, sensory: no deficits  Psychiatric *Orientation: to person, to place, to time *Mood and Affect: active and alert, normal mood, normal affect  CBC    Component Value Date/Time   WBC 6.3 05/18/2021 1351   RBC 4.69 05/18/2021 1351   HGB 14.8 05/18/2021 1351   HCT 44.0 05/18/2021 1351   PLT 241 05/18/2021 1351   MCV 93.8 05/18/2021 1351   MCH 31.6 05/18/2021 1351   MCHC 33.6 05/18/2021 1351   RDW 13.1  05/18/2021 1351   LYMPHSABS 2.2 05/18/2021 1351   MONOABS 0.6 05/18/2021 1351   EOSABS 0.8 (H) 05/18/2021 1351   BASOSABS 0.0 05/18/2021 1351   CMP     Component Value Date/Time   NA 133 (L) 05/18/2021 1351   K 4.0  05/18/2021 1351   CL 99 05/18/2021 1351   CO2 26 05/18/2021 1351   GLUCOSE 110 (H) 05/18/2021 1351   BUN 11 05/18/2021 1351   CREATININE 0.80 05/18/2021 1351   CALCIUM 9.3 05/18/2021 1351   PROT 7.2 05/18/2021 1351   ALBUMIN 4.3 05/18/2021 1351   AST 37 05/18/2021 1351   ALT 25 05/18/2021 1351   ALKPHOS 49 05/18/2021 1351   BILITOT 0.9 05/18/2021 1351   GFRNONAA >60 05/18/2021 1351   GFRAA >60 06/17/2015 1220       Assessment/Plan: 55Y P1 with 26 week fibroid uterus - Plan: Total abdominal hysterectomy, bilateral salpingectomy, possible bilateral or unilateral oophorectomy - Informed consent obtained. Reviewed risks of infection, bleeding, damage to surrounding organs, need for vertical incision. All questions answered.  - Ancef 2g - Anesthesiologist planning TAP block - Will be admitted to inpatient postoperatively  Rowland Lathe 05/21/2021, 5:30 PM

## 2021-05-22 ENCOUNTER — Encounter (HOSPITAL_COMMUNITY)
Admission: RE | Disposition: A | Discharge status: Home / Self Care | Payer: Self-pay | Source: Home / Self Care | Attending: Obstetrics and Gynecology

## 2021-05-22 ENCOUNTER — Inpatient Hospital Stay (HOSPITAL_COMMUNITY)
Admission: RE | Admit: 2021-05-22 | Discharge: 2021-05-24 | DRG: 742 | Disposition: A | Discharge status: Home / Self Care | Payer: 59 | Attending: Obstetrics and Gynecology | Admitting: Obstetrics and Gynecology

## 2021-05-22 ENCOUNTER — Encounter (HOSPITAL_COMMUNITY): Payer: Self-pay | Admitting: Obstetrics and Gynecology

## 2021-05-22 ENCOUNTER — Other Ambulatory Visit: Payer: Self-pay

## 2021-05-22 ENCOUNTER — Inpatient Hospital Stay (HOSPITAL_COMMUNITY): Payer: 59 | Admitting: Anesthesiology

## 2021-05-22 ENCOUNTER — Inpatient Hospital Stay (HOSPITAL_COMMUNITY): Payer: 59 | Admitting: Physician Assistant

## 2021-05-22 DIAGNOSIS — Z6838 Body mass index (BMI) 38.0-38.9, adult: Secondary | ICD-10-CM | POA: Diagnosis not present

## 2021-05-22 DIAGNOSIS — Z90710 Acquired absence of both cervix and uterus: Secondary | ICD-10-CM | POA: Diagnosis present

## 2021-05-22 DIAGNOSIS — Z01818 Encounter for other preprocedural examination: Secondary | ICD-10-CM

## 2021-05-22 DIAGNOSIS — D62 Acute posthemorrhagic anemia: Secondary | ICD-10-CM | POA: Diagnosis not present

## 2021-05-22 DIAGNOSIS — D259 Leiomyoma of uterus, unspecified: Secondary | ICD-10-CM | POA: Diagnosis present

## 2021-05-22 DIAGNOSIS — I10 Essential (primary) hypertension: Secondary | ICD-10-CM | POA: Diagnosis present

## 2021-05-22 DIAGNOSIS — N736 Female pelvic peritoneal adhesions (postinfective): Secondary | ICD-10-CM | POA: Diagnosis present

## 2021-05-22 HISTORY — PX: HYSTERECTOMY ABDOMINAL WITH SALPINGECTOMY: SHX6725

## 2021-05-22 HISTORY — PX: LYSIS OF ADHESION: SHX5961

## 2021-05-22 LAB — APTT: aPTT: 24 seconds (ref 24–36)

## 2021-05-22 LAB — CBC WITH DIFFERENTIAL/PLATELET
Abs Immature Granulocytes: 0.07 10*3/uL (ref 0.00–0.07)
Basophils Absolute: 0 10*3/uL (ref 0.0–0.1)
Basophils Relative: 0 %
Eosinophils Absolute: 0.2 10*3/uL (ref 0.0–0.5)
Eosinophils Relative: 2 %
HCT: 33.5 % — ABNORMAL LOW (ref 36.0–46.0)
Hemoglobin: 11.4 g/dL — ABNORMAL LOW (ref 12.0–15.0)
Immature Granulocytes: 1 %
Lymphocytes Relative: 18 %
Lymphs Abs: 1.3 10*3/uL (ref 0.7–4.0)
MCH: 31.1 pg (ref 26.0–34.0)
MCHC: 34 g/dL (ref 30.0–36.0)
MCV: 91.3 fL (ref 80.0–100.0)
Monocytes Absolute: 0.5 10*3/uL (ref 0.1–1.0)
Monocytes Relative: 7 %
Neutro Abs: 5.1 10*3/uL (ref 1.7–7.7)
Neutrophils Relative %: 72 %
Platelets: 156 10*3/uL (ref 150–400)
RBC: 3.67 MIL/uL — ABNORMAL LOW (ref 3.87–5.11)
RDW: 13.8 % (ref 11.5–15.5)
WBC: 7.1 10*3/uL (ref 4.0–10.5)
nRBC: 0 % (ref 0.0–0.2)

## 2021-05-22 LAB — PROTIME-INR
INR: 1.2 (ref 0.8–1.2)
Prothrombin Time: 15.5 seconds — ABNORMAL HIGH (ref 11.4–15.2)

## 2021-05-22 LAB — ABO/RH: ABO/RH(D): A POS

## 2021-05-22 LAB — FIBRINOGEN: Fibrinogen: 223 mg/dL (ref 210–475)

## 2021-05-22 LAB — PREPARE RBC (CROSSMATCH)

## 2021-05-22 SURGERY — HYSTERECTOMY, TOTAL, ABDOMINAL, WITH SALPINGECTOMY
Anesthesia: General | Site: Abdomen

## 2021-05-22 MED ORDER — ROCURONIUM BROMIDE 10 MG/ML (PF) SYRINGE
PREFILLED_SYRINGE | INTRAVENOUS | Status: DC | PRN
Start: 2021-05-22 — End: 2021-05-22
  Administered 2021-05-22: 10 mg via INTRAVENOUS
  Administered 2021-05-22: 100 mg via INTRAVENOUS
  Administered 2021-05-22: 20 mg via INTRAVENOUS

## 2021-05-22 MED ORDER — DEXMEDETOMIDINE (PRECEDEX) IN NS 20 MCG/5ML (4 MCG/ML) IV SYRINGE
PREFILLED_SYRINGE | INTRAVENOUS | Status: DC | PRN
  Administered 2021-05-22: 20 ug via INTRAVENOUS

## 2021-05-22 MED ORDER — DEXAMETHASONE SODIUM PHOSPHATE 10 MG/ML IJ SOLN
INTRAMUSCULAR | Status: DC | PRN
  Administered 2021-05-22: 10 mg via INTRAVENOUS

## 2021-05-22 MED ORDER — MIDAZOLAM HCL 2 MG/2ML IJ SOLN
INTRAMUSCULAR | Status: DC | PRN
  Administered 2021-05-22: 2 mg via INTRAVENOUS

## 2021-05-22 MED ORDER — IRBESARTAN 75 MG PO TABS
37.5000 mg | ORAL_TABLET | Freq: Every day | ORAL | Status: DC
  Administered 2021-05-23 – 2021-05-24 (×2): 37.5 mg via ORAL
  Filled 2021-05-22 (×2): qty 0.5

## 2021-05-22 MED ORDER — BISACODYL 10 MG RE SUPP: 10.0000 mg | Freq: Every day | RECTAL | Status: DC | PRN

## 2021-05-22 MED ORDER — SOD CITRATE-CITRIC ACID 500-334 MG/5ML PO SOLN
30.0000 mL | ORAL | Status: AC
  Administered 2021-05-22: 30 mL via ORAL
  Filled 2021-05-22: qty 30

## 2021-05-22 MED ORDER — FLEET ENEMA 7-19 GM/118ML RE ENEM: 1.0000 | ENEMA | Freq: Once | RECTAL | Status: DC | PRN

## 2021-05-22 MED ORDER — PROPOFOL 10 MG/ML IV BOLUS
INTRAVENOUS | Status: AC
  Filled 2021-05-22: qty 20

## 2021-05-22 MED ORDER — CEFAZOLIN SODIUM-DEXTROSE 2-4 GM/100ML-% IV SOLN
2.0000 g | INTRAVENOUS | Status: AC
  Administered 2021-05-22: 2 g via INTRAVENOUS
  Filled 2021-05-22: qty 100

## 2021-05-22 MED ORDER — SCOPOLAMINE 1 MG/3DAYS TD PT72
1.0000 | MEDICATED_PATCH | TRANSDERMAL | Status: DC
  Administered 2021-05-22: 1.5 mg via TRANSDERMAL
  Filled 2021-05-22: qty 1

## 2021-05-22 MED ORDER — HYDROMORPHONE HCL 1 MG/ML IJ SOLN
INTRAMUSCULAR | Status: AC
  Filled 2021-05-22: qty 1

## 2021-05-22 MED ORDER — ONDANSETRON HCL 4 MG/2ML IJ SOLN
INTRAMUSCULAR | Status: AC
  Filled 2021-05-22: qty 2

## 2021-05-22 MED ORDER — LIDOCAINE 2% (20 MG/ML) 5 ML SYRINGE
INTRAMUSCULAR | Status: AC
  Filled 2021-05-22: qty 5

## 2021-05-22 MED ORDER — KETAMINE HCL 50 MG/5ML IJ SOSY
PREFILLED_SYRINGE | INTRAMUSCULAR | Status: AC
  Filled 2021-05-22: qty 5

## 2021-05-22 MED ORDER — MIDAZOLAM HCL 2 MG/2ML IJ SOLN
INTRAMUSCULAR | Status: AC
  Filled 2021-05-22: qty 2

## 2021-05-22 MED ORDER — CHLORHEXIDINE GLUCONATE 0.12 % MT SOLN: 15.0000 mL | Freq: Once | OROMUCOSAL | Status: AC

## 2021-05-22 MED ORDER — DEXAMETHASONE SODIUM PHOSPHATE 4 MG/ML IJ SOLN
INTRAMUSCULAR | Status: DC | PRN
  Administered 2021-05-22 (×2): 2 mg

## 2021-05-22 MED ORDER — FENTANYL CITRATE (PF) 250 MCG/5ML IJ SOLN
INTRAMUSCULAR | Status: DC | PRN
  Administered 2021-05-22 (×2): 25 ug via INTRAVENOUS
  Administered 2021-05-22: 50 ug via INTRAVENOUS
  Administered 2021-05-22 (×2): 25 ug via INTRAVENOUS
  Administered 2021-05-22: 100 ug via INTRAVENOUS

## 2021-05-22 MED ORDER — KETAMINE HCL 10 MG/ML IJ SOLN
INTRAMUSCULAR | Status: DC | PRN
  Administered 2021-05-22: 50 mg via INTRAVENOUS

## 2021-05-22 MED ORDER — ONDANSETRON HCL 4 MG/2ML IJ SOLN
4.0000 mg | Freq: Four times a day (QID) | INTRAMUSCULAR | Status: DC | PRN
  Administered 2021-05-22: 4 mg via INTRAVENOUS
  Filled 2021-05-22: qty 2

## 2021-05-22 MED ORDER — HYDROMORPHONE HCL 1 MG/ML IJ SOLN
0.2500 mg | INTRAMUSCULAR | Status: DC | PRN
  Administered 2021-05-22 (×4): 0.5 mg via INTRAVENOUS

## 2021-05-22 MED ORDER — KETOROLAC TROMETHAMINE 30 MG/ML IJ SOLN
30.0000 mg | Freq: Four times a day (QID) | INTRAMUSCULAR | Status: AC
  Administered 2021-05-22 – 2021-05-23 (×4): 30 mg via INTRAVENOUS
  Filled 2021-05-22 (×4): qty 1

## 2021-05-22 MED ORDER — PHENYLEPHRINE 40 MCG/ML (10ML) SYRINGE FOR IV PUSH (FOR BLOOD PRESSURE SUPPORT)
PREFILLED_SYRINGE | INTRAVENOUS | Status: AC
  Filled 2021-05-22: qty 10

## 2021-05-22 MED ORDER — ROPIVACAINE HCL 5 MG/ML IJ SOLN
INTRAMUSCULAR | Status: DC | PRN
  Administered 2021-05-22 (×2): 20 mL

## 2021-05-22 MED ORDER — ALBUMIN HUMAN 5 % IV SOLN
INTRAVENOUS | Status: DC | PRN
Start: 2021-05-22 — End: 2021-05-22

## 2021-05-22 MED ORDER — DEXAMETHASONE SODIUM PHOSPHATE 10 MG/ML IJ SOLN
INTRAMUSCULAR | Status: AC
  Filled 2021-05-22: qty 1

## 2021-05-22 MED ORDER — SODIUM CHLORIDE 0.9 % IV SOLN: INTRAVENOUS | Status: DC | PRN

## 2021-05-22 MED ORDER — MENTHOL 3 MG MT LOZG: 1.0000 | LOZENGE | OROMUCOSAL | Status: DC | PRN

## 2021-05-22 MED ORDER — ORAL CARE MOUTH RINSE: 15.0000 mL | Freq: Once | OROMUCOSAL | Status: AC

## 2021-05-22 MED ORDER — LACTATED RINGERS IV SOLN
INTRAVENOUS | Status: DC | PRN
Start: 2021-05-22 — End: 2021-05-22

## 2021-05-22 MED ORDER — SODIUM CHLORIDE 0.9 % IV SOLN: 10.0000 mL/h | Freq: Once | INTRAVENOUS | Status: DC

## 2021-05-22 MED ORDER — FENTANYL CITRATE (PF) 250 MCG/5ML IJ SOLN
INTRAMUSCULAR | Status: AC
  Filled 2021-05-22: qty 5

## 2021-05-22 MED ORDER — 0.9 % SODIUM CHLORIDE (POUR BTL) OPTIME
TOPICAL | Status: DC | PRN
  Administered 2021-05-22: 1000 mL
  Administered 2021-05-22: 2000 mL

## 2021-05-22 MED ORDER — IBUPROFEN 600 MG PO TABS
600.0000 mg | ORAL_TABLET | Freq: Four times a day (QID) | ORAL | Status: DC
  Administered 2021-05-23 – 2021-05-24 (×4): 600 mg via ORAL
  Filled 2021-05-22 (×4): qty 1

## 2021-05-22 MED ORDER — HYDROCHLOROTHIAZIDE 25 MG PO TABS
12.5000 mg | ORAL_TABLET | Freq: Every day | ORAL | Status: DC
  Administered 2021-05-23 – 2021-05-24 (×2): 12.5 mg via ORAL
  Filled 2021-05-22 (×2): qty 1

## 2021-05-22 MED ORDER — OXYCODONE HCL 5 MG PO TABS: 5.0000 mg | ORAL_TABLET | ORAL | Status: DC | PRN

## 2021-05-22 MED ORDER — ONDANSETRON HCL 4 MG/2ML IJ SOLN
INTRAMUSCULAR | Status: DC | PRN
Start: 2021-05-22 — End: 2021-05-22
  Administered 2021-05-22: 4 mg via INTRAVENOUS

## 2021-05-22 MED ORDER — HYDROMORPHONE HCL 1 MG/ML IJ SOLN
0.2000 mg | INTRAMUSCULAR | Status: DC | PRN
  Administered 2021-05-22: 0.6 mg via INTRAVENOUS
  Filled 2021-05-22: qty 1

## 2021-05-22 MED ORDER — LACTATED RINGERS IV SOLN: INTRAVENOUS | Status: DC | PRN

## 2021-05-22 MED ORDER — ACETAMINOPHEN 500 MG PO TABS
1000.0000 mg | ORAL_TABLET | Freq: Four times a day (QID) | ORAL | Status: DC
  Administered 2021-05-22 – 2021-05-24 (×8): 1000 mg via ORAL
  Filled 2021-05-22 (×8): qty 2

## 2021-05-22 MED ORDER — POVIDONE-IODINE 10 % EX SWAB
2.0000 "application " | Freq: Once | CUTANEOUS | Status: AC
  Administered 2021-05-22: 2 via TOPICAL

## 2021-05-22 MED ORDER — PHENYLEPHRINE 40 MCG/ML (10ML) SYRINGE FOR IV PUSH (FOR BLOOD PRESSURE SUPPORT)
PREFILLED_SYRINGE | INTRAVENOUS | Status: DC | PRN
  Administered 2021-05-22: 80 ug via INTRAVENOUS
  Administered 2021-05-22 (×3): 120 ug via INTRAVENOUS
  Administered 2021-05-22: 80 ug via INTRAVENOUS
  Administered 2021-05-22: 120 ug via INTRAVENOUS

## 2021-05-22 MED ORDER — CLONIDINE HCL (ANALGESIA) 100 MCG/ML EP SOLN
EPIDURAL | Status: DC | PRN
  Administered 2021-05-22 (×2): 40 ug

## 2021-05-22 MED ORDER — LACTATED RINGERS IV SOLN
INTRAVENOUS | Status: DC
  Administered 2021-05-23: 04:00:00 100 mL/h via INTRAVENOUS

## 2021-05-22 MED ORDER — MEPERIDINE HCL 25 MG/ML IJ SOLN: 6.2500 mg | INTRAMUSCULAR | Status: DC | PRN

## 2021-05-22 MED ORDER — LIDOCAINE 2% (20 MG/ML) 5 ML SYRINGE
INTRAMUSCULAR | Status: DC | PRN
  Administered 2021-05-22: 100 mg via INTRAVENOUS

## 2021-05-22 MED ORDER — LACTATED RINGERS IV SOLN: INTRAVENOUS | Status: DC

## 2021-05-22 MED ORDER — ROCURONIUM BROMIDE 10 MG/ML (PF) SYRINGE
PREFILLED_SYRINGE | INTRAVENOUS | Status: AC
  Filled 2021-05-22: qty 20

## 2021-05-22 MED ORDER — DOCUSATE SODIUM 100 MG PO CAPS
100.0000 mg | ORAL_CAPSULE | Freq: Two times a day (BID) | ORAL | Status: DC
  Administered 2021-05-22 – 2021-05-24 (×5): 100 mg via ORAL
  Filled 2021-05-22 (×5): qty 1

## 2021-05-22 MED ORDER — PHENYLEPHRINE HCL-NACL 20-0.9 MG/250ML-% IV SOLN
INTRAVENOUS | Status: DC | PRN
  Administered 2021-05-22: 25 ug/min via INTRAVENOUS

## 2021-05-22 MED ORDER — VASOPRESSIN 20 UNIT/ML IV SOLN
INTRAVENOUS | Status: DC | PRN
  Administered 2021-05-22: 10 mL via INTRAMUSCULAR

## 2021-05-22 MED ORDER — VASOPRESSIN 20 UNIT/ML IV SOLN
INTRAVENOUS | Status: AC
  Filled 2021-05-22: qty 1

## 2021-05-22 MED ORDER — POLYETHYLENE GLYCOL 3350 17 G PO PACK: 17.0000 g | PACK | Freq: Every day | ORAL | Status: DC | PRN

## 2021-05-22 MED ORDER — SUGAMMADEX SODIUM 200 MG/2ML IV SOLN
INTRAVENOUS | Status: DC | PRN
Start: 2021-05-22 — End: 2021-05-22
  Administered 2021-05-22: 400 mg via INTRAVENOUS

## 2021-05-22 MED ORDER — ONDANSETRON HCL 4 MG PO TABS: 4.0000 mg | ORAL_TABLET | Freq: Four times a day (QID) | ORAL | Status: DC | PRN

## 2021-05-22 MED ORDER — PROMETHAZINE HCL 25 MG/ML IJ SOLN: 6.2500 mg | INTRAMUSCULAR | Status: DC | PRN

## 2021-05-22 MED ORDER — CHLORHEXIDINE GLUCONATE 0.12 % MT SOLN
OROMUCOSAL | Status: AC
  Administered 2021-05-22: 15 mL via OROMUCOSAL
  Filled 2021-05-22: qty 15

## 2021-05-22 MED ORDER — SIMETHICONE 80 MG PO CHEW
80.0000 mg | CHEWABLE_TABLET | Freq: Four times a day (QID) | ORAL | Status: DC | PRN
  Administered 2021-05-22 – 2021-05-23 (×5): 80 mg via ORAL
  Filled 2021-05-22 (×5): qty 1

## 2021-05-22 MED ORDER — PROPOFOL 10 MG/ML IV BOLUS
INTRAVENOUS | Status: DC | PRN
  Administered 2021-05-22: 140 mg via INTRAVENOUS

## 2021-05-22 MED ORDER — ACETAMINOPHEN 500 MG PO TABS
1000.0000 mg | ORAL_TABLET | ORAL | Status: AC
  Administered 2021-05-22: 1000 mg via ORAL
  Filled 2021-05-22: qty 2

## 2021-05-22 MED ORDER — SODIUM CHLORIDE (PF) 0.9 % IJ SOLN
INTRAMUSCULAR | Status: AC
  Filled 2021-05-22: qty 100

## 2021-05-22 SURGICAL SUPPLY — 46 items
ATTRACTOMAT 16X20 MAGNETIC DRP (DRAPES) ×2 IMPLANT
BAG COUNTER SPONGE SURGICOUNT (BAG) ×3 IMPLANT
BAG SURGICOUNT SPONGE COUNTING (BAG) ×1
BENZOIN TINCTURE PRP APPL 2/3 (GAUZE/BANDAGES/DRESSINGS) ×4 IMPLANT
CANISTER SUCT 3000ML PPV (MISCELLANEOUS) ×6 IMPLANT
CLOSURE WOUND 1/2 X4 (GAUZE/BANDAGES/DRESSINGS) ×2
DECANTER SPIKE VIAL GLASS SM (MISCELLANEOUS) IMPLANT
DRAPE WARM FLUID 44X44 (DRAPES) ×2 IMPLANT
DRSG OPSITE POSTOP 4X10 (GAUZE/BANDAGES/DRESSINGS) ×2 IMPLANT
DRSG OPSITE POSTOP 4X12 (GAUZE/BANDAGES/DRESSINGS) ×2 IMPLANT
DURAPREP 26ML APPLICATOR (WOUND CARE) ×4 IMPLANT
ELECT BLADE 6.5 EXT (BLADE) ×2 IMPLANT
GAUZE 4X4 16PLY ~~LOC~~+RFID DBL (SPONGE) ×2 IMPLANT
GLOVE SURG ENC MOIS LTX SZ6 (GLOVE) ×10 IMPLANT
GLOVE SURG UNDER POLY LF SZ6.5 (GLOVE) ×2 IMPLANT
GLOVE SURG UNDER POLY LF SZ7 (GLOVE) ×4 IMPLANT
GOWN STRL REUS W/ TWL LRG LVL3 (GOWN DISPOSABLE) ×4 IMPLANT
GOWN STRL REUS W/TWL LRG LVL3 (GOWN DISPOSABLE) ×8
HIBICLENS CHG 4% 4OZ BTL (MISCELLANEOUS) ×4 IMPLANT
KIT TURNOVER KIT B (KITS) ×4 IMPLANT
NDL 18GX1X1/2 (RX/OR ONLY) (NEEDLE) IMPLANT
NEEDLE 18GX1X1/2 (RX/OR ONLY) (NEEDLE) ×4 IMPLANT
PACK ABDOMINAL GYN (CUSTOM PROCEDURE TRAY) ×4 IMPLANT
PAD ARMBOARD 7.5X6 YLW CONV (MISCELLANEOUS) ×4 IMPLANT
PAD OB MATERNITY 4.3X12.25 (PERSONAL CARE ITEMS) ×2 IMPLANT
SPECIMEN JAR MEDIUM (MISCELLANEOUS) ×4 IMPLANT
SPONGE T-LAP 18X18 ~~LOC~~+RFID (SPONGE) ×14 IMPLANT
STRIP CLOSURE SKIN 1/2X4 (GAUZE/BANDAGES/DRESSINGS) ×4 IMPLANT
SURGIFLO W/THROMBIN 8M KIT (HEMOSTASIS) ×2 IMPLANT
SUT MNCRL AB 3-0 PS2 27 (SUTURE) ×2 IMPLANT
SUT PDS AB 0 CTX 36 PDP370T (SUTURE) ×4 IMPLANT
SUT PDS AB 0 CTX 60 (SUTURE) ×4 IMPLANT
SUT PLAIN 2 0 XLH (SUTURE) IMPLANT
SUT VIC AB 0 CT1 18XCR BRD8 (SUTURE) ×4 IMPLANT
SUT VIC AB 0 CT1 8-18 (SUTURE) ×12
SUT VIC AB 0 CTX 36 (SUTURE) ×6
SUT VIC AB 0 CTX36XBRD ANBCTRL (SUTURE) ×4 IMPLANT
SUT VIC AB 2-0 CT1 (SUTURE) ×6 IMPLANT
SUT VIC AB 3-0 SH 27 (SUTURE) ×8
SUT VIC AB 3-0 SH 27X BRD (SUTURE) IMPLANT
SUT VIC AB 4-0 KS 27 (SUTURE) ×2 IMPLANT
SUT VICRYL 0 TIES 12 18 (SUTURE) ×2 IMPLANT
SYR CONTROL 10ML LL (SYRINGE) ×4 IMPLANT
TOWEL GREEN STERILE FF (TOWEL DISPOSABLE) ×8 IMPLANT
TRAY FOLEY W/BAG SLVR 16FR (SET/KITS/TRAYS/PACK) ×4
TRAY FOLEY W/BAG SLVR 16FR ST (SET/KITS/TRAYS/PACK) ×2 IMPLANT

## 2021-05-22 NOTE — Transfer of Care (Signed)
Immediate Anesthesia Transfer of Care Note  Patient: Jane Nunez  Procedure(s) Performed: HYSTERECTOMY ABDOMINAL WITH SALPINGECTOMY (Abdomen) LYSIS OF ADHESION (Abdomen)  Patient Location: PACU  Anesthesia Type:General  Level of Consciousness: awake, alert  and oriented  Airway & Oxygen Therapy: Patient Spontanous Breathing and Patient connected to nasal cannula oxygen  Post-op Assessment: Report given to RN and Post -op Vital signs reviewed and stable  Post vital signs: Reviewed and stable  Last Vitals:  Vitals Value Taken Time  BP 99/67 05/22/21 1150  Temp    Pulse 83 05/22/21 1150  Resp 23 05/22/21 1150  SpO2 98 % 05/22/21 1150  Vitals shown include unvalidated device data.  Last Pain:  Vitals:   05/22/21 0606  PainSc: 0-No pain         Complications: No notable events documented.

## 2021-05-22 NOTE — Brief Op Note (Addendum)
05/22/2021  11:38 AM  PATIENT:  Jane Nunez  56 y.o. female  PRE-OPERATIVE DIAGNOSIS:  uterine leiomyoma  POST-OPERATIVE DIAGNOSIS:  uterine leiomyoma  PROCEDURE:   Total abdominal hysterectomy, bilateral salpingectomy Lysis of adhesions by Dr.Thompson  SURGEON:  Surgeon(s) and Role: Panel 1:    * Jaece Ducharme, Edwinna Areola, MD - Primary    Jerelyn Charles, MD - Assisting Panel 2:    Georganna Skeans, MD - Primary   ANESTHESIA:   general, TAP block  EBL:  2000 mL   BLOOD ADMINISTERED: 2 units PRBCS  DRAINS: Urinary Catheter (Foley)    SPECIMEN:  Source of Specimen:  uterus, cervix, uterine leiomyoma , bilateral fallopian tubes  DISPOSITION OF SPECIMEN:  PATHOLOGY  COUNTS:  YES  TOURNIQUET:  * No tourniquets in log *  DICTATION: .Note written in EPIC  PLAN OF CARE: Admit to inpatient   PATIENT DISPOSITION:  PACU - hemodynamically stable.   Delay start of Pharmacological VTE agent (>24hrs) due to surgical blood loss or risk of bleeding: yes

## 2021-05-22 NOTE — Op Note (Signed)
°  05/22/2021  9:38 AM  PATIENT:  Jane Nunez  56 y.o. female  PRE-OPERATIVE DIAGNOSIS:  uterine leiomyoma, intra-abdominal adhesions  POST-OPERATIVE DIAGNOSIS:  uterine leiomyoma, intra-abdominal adhesions  PROCEDURE:  Procedure(s): Lysis of adhesions 1min  SURGEON:  Georganna Skeans, MD  ASSISTANTS: Irene Pap, MD   ANESTHESIA:   general  EBL:  Total I/O In: 2500 [I.V.:2000; IV Piggyback:500] Out: 1250 [Blood:1250]  BLOOD ADMINISTERED:none  DRAINS: none   SPECIMEN:  No Specimen  DISPOSITION OF SPECIMEN:  N/A  COUNTS:  YES  DICTATION: .Dragon Dictation I was called for an intraoperative consult on this 56 year old female who is undergoing resection of a very large uterine fibroid as well as hysterectomy.  She was found to have a large number of small bowel adhesions to the fibroid.  I scrubbed into the case and there were numerous small bowel adhesions to the superior portion of this very large fibroid.  I used sharp dissection to free up the small bowel and small bowel mesentery from various areas of fibroid where it was quite stuck.  The fibroid was then removed and passed off the table.  There were no enterotomies.  There was some bleeding from some adhesions on the small bowel.  I placed several 3-0 Vicryl sutures and got excellent hemostasis.  The areas along the mesentery that were stuck were also inspected closely and there was no ongoing bleeding.  Small bowel appeared uninjured.  Lysis of adhesions time was 20 minutes.  We then inspected the uterus and while it had some other fibroids it appeared free from other bowel adhesions.  This completed my portion of the procedure.  The patient remains in the operating room.  No apparent complications from this portion. PATIENT DISPOSITION:   remains in OR   Delay start of Pharmacological VTE agent (>24hrs) due to surgical blood loss or risk of bleeding:  no  Georganna Skeans, MD, MPH, FACS Pager: [phone]   1/23/20239:38 AM

## 2021-05-22 NOTE — Op Note (Addendum)
05/22/2021   11:38 AM   PATIENT:  Jane Nunez  56 y.o. female   PRE-OPERATIVE DIAGNOSIS:  21 cm uterine leiomyoma   POST-OPERATIVE DIAGNOSIS:  21cm uterine leiomyoma, multifibroid uterus, adhesive disease   PROCEDURE:   Total abdominal hysterectomy, bilateral salpingectomy Lysis of adhesions by Dr.Thompson (General Surgery)   SURGEON:  Surgeon(s) and Role: Panel 1:    * Rowland Lathe, MD - Primary    Jerelyn Charles, MD - Assisting Panel 2:    Georganna Skeans, MD - Primary   ANESTHESIA:   general, TAP block   EBL:  2000 mL (majority of blood loss from ligation of the pedunculated fibroid stalk)   BLOOD ADMINISTERED: 2 units PRBCS   DRAINS: Urinary Catheter (Foley)    SPECIMEN:  Source of Specimen:  uterus, cervix, bilateral fallopian tubes, uterine leiomyoma   DISPOSITION OF SPECIMEN:  PATHOLOGY   COUNTS:  YES   PLAN OF CARE: Admit to inpatient    PATIENT DISPOSITION:  PACU - hemodynamically stable.  FINDINGS: Small uterus with 21 cm pedunculated fundal fibroid, a 3cm right anterior fundal fibroid, and a 2cm posterior fibroid. The large 21cm fibroid noted to have significant vascularity as well as edematous adhesions to the small bowel and mesentery. Right fallopian tube had scattered small fatty appearing implants. Left fallopian tube normal appearing. Bilateral ovaries normal appearing. Bladder adhesions to lower anterior uterus.   PROCEDURE IN DETAIL: The patient was appropriately consented. She received a TAP block preoperative by anesthesia.  She was taken to the operating room. General anesthesia was obtained without difficulty. She was placed in the supine position. Thromboguards were on and cycling. Two grams of Ancef were given for infection prophylaxis. A foley was inserted and draining clear yellow urine. She was prepped and draped in normal sterile fashion.    A Pfannenstiel skin incision was made with the scalpel. The incision was carried down to the  fascia with the scalpel. The fascia was incised and extended laterally with Mayo scissors. The inferior aspect of the fascia was grasped with the Kocher clamps. The underlying rectus muscle and pyramidalis were dissected off bluntly. In a similar fashion, the superior aspect of the fascia was elevated with the Kochers, and the rectus muscle was dissected off. The rectus muscle was separated in the midline. The peritoneum was found the be free of adherent bowel and entered bluntly. A churney incision was made on the left rectus muscle with the bovie.   Intraabdominal survey revealed findings as noted above. The large fibroid could not be delivered through the laparotomy initially. Decision was made to ligate the pedunculated fibroid at its stalk to allow for better maneuverability. The stalk was injected with 10cc of vasopressin and ligated with the ligasure. Significant bleeding was noted and hemostasis was achieved with 0 vicryl suture in figure of 8 stitches on both the uterus and fibroid. The fibroid was delivered through the laparotomy. Significant small bowel and mesentery adhesions were noted on the posterior and cephalad portion of the fibroid.   Assistance from General Surgery was requested. Dr. Grandville Silos from General Surgery scrubbed in and freed the fibroid from the small bowel adhesions as well as ensured hemostasis at dissection sites. Please see Dr. Biagio Borg operative note for complete details.   The large fibroid was then completely separated and removed from the field.   At this point, intraoperative iStat was reported to show a hemoglobin of 8, and decision was made to transfuse 2 units of PRBC.  The bowel was then packed and an Harmon Pier was placed. Kocher clamps were placed at bilateral cornua of the uterus. Bilateral fallopian tubes were serially ligated and removed with the Ligasure device. Upward traction was applied to facilitate exposure and dissection. The  round ligaments were identified and ligated with the bovie.  The anterior leaf of the broad ligaments were dissected to develop a bladder flap. The uterine vessels were skeletonized, clamped and ligated with the 0 vicryl suture. Good hemostasis was noted. Curved Heaney clamps were placed below the cervix and the uterus and cervix were separated from the vagina using Jorgenson scissors. Bilateral vaginal angles were tagged with Heaney stitches. The remainder of the vaginal cuff was closed with 0 vicryl in figure of 8 stitches.   Attention was turned back to the abdominal field. The pelvis was irrigated with warm normal saline. Surgifo was applied to an area of oozing under the bladder flap and hemostasis was appreciated.   The dissection sites of the small bowel and mesentery were inspected and hemostasis was confirmed. The peritoneum was closed with 2-0 vicryl in a running fashion. The rectus muscles were inspected and rendered hemostatic with cautery and Surgiflo.   The fascial layer was closed with 0-PDS in a running fashion with each of 2 sutures started from each angle. The subcutaneous tissue layer was irrigated, rendered hemostatic, and reapproximated with 3-0 vicryl in a running fashion. The skin was closed with 4-0 vicryl in a subcuticular fashion. Steri strips and a Honey comb dressing were applied to the incision. The patient tolerated the procedure well. All counts were correct times two. The patient was taken to the recovery room in a stable condition.  Luther Redo, MD 05/22/21 12:49 PM

## 2021-05-22 NOTE — Progress Notes (Signed)
GYN Progress Note  Pt seen at bedside  S: states pain is currently well controlled. Was able to walk with assistance around room without lightheadedness or dizziness. Tolerated Jello and part of a [place] burger without nausea/vomiting.   Today's Vitals   05/22/21 1440 05/22/21 1444 05/22/21 1624 05/22/21 1838  BP:  98/65 101/69   Pulse:  70 77   Resp:  16 16   Temp:  97.6 F (36.4 C) 98 F (36.7 C)   TempSrc:  Oral Oral   SpO2:  95% 97%   Weight:      Height:      PainSc: 1   1  5     Body mass index is 38.44 kg/m.   Gen: well appearing, no distress Abd: soft, appropriately tender, mildly distended. Incision covered with clean/dry honeycomb GU: Foley in place draining yellow urine   CBC    Component Value Date/Time   WBC 7.1 05/22/2021 1220   RBC 3.67 (L) 05/22/2021 1220   HGB 11.4 (L) 05/22/2021 1220   HCT 33.5 (L) 05/22/2021 1220   PLT 156 05/22/2021 1220   MCV 91.3 05/22/2021 1220   MCH 31.1 05/22/2021 1220   MCHC 34.0 05/22/2021 1220   RDW 13.8 05/22/2021 1220   LYMPHSABS 1.3 05/22/2021 1220   MONOABS 0.5 05/22/2021 1220   EOSABS 0.2 05/22/2021 1220   BASOSABS 0.0 05/22/2021 1220   A/P POD0 s/p TAH, salpingectomy, LOA for large fibroid uterus.  - doing well postoperatively, continue routine postop care - Surgery complicated by intraop EBL 2023mL and blood transfusion (2 units)s: PACU hgb 11.4 with normal coags. Will check AM labs - Good UOP: d/c foley and follow up void - Encouraged ambulation - HTN: continue home meds (HCTZ and Valsartan)  M. Brien Mates, MD 05/22/21 6:55 PM

## 2021-05-22 NOTE — Anesthesia Procedure Notes (Signed)
Anesthesia Regional Block: TAP block   Pre-Anesthetic Checklist: , timeout performed,  Correct Patient, Correct Site, Correct Laterality,  Correct Procedure, Correct Position, site marked,  Risks and benefits discussed,  Surgical consent,  Pre-op evaluation,  At surgeon's request and post-op pain management  Laterality: Right and Left  Prep: chloraprep       Needles:  Injection technique: Single-shot  Needle Type: Echogenic Stimulator Needle      Needle Gauge: 21     Additional Needles:   Procedures:,,,, ultrasound used (permanent image in chart),,    Narrative:  Start time: 05/22/2021 7:00 AM End time: 05/22/2021 7:28 AM Injection made incrementally with aspirations every 5 mL.  Performed by: Personally  Anesthesiologist: Nolon Nations, MD  Additional Notes: BP cuff, SpO2 and EKG monitors applied. Sedation begun.  Anesthetic injected incrementally, slowly, and after neg aspirations under direct ultrasound guidance. Good fascial spread noted. Patient tolerated well.

## 2021-05-22 NOTE — Anesthesia Procedure Notes (Signed)
Procedure Name: Intubation Date/Time: 05/22/2021 7:52 AM Performed by: Tressie Ellis, CRNA Pre-anesthesia Checklist: Patient identified, Emergency Drugs available, Suction available and Patient being monitored Patient Re-evaluated:Patient Re-evaluated prior to induction Oxygen Delivery Method: Circle system utilized Preoxygenation: Pre-oxygenation with 100% oxygen Induction Type: IV induction Ventilation: Mask ventilation without difficulty Laryngoscope Size: Miller and 2 Grade View: Grade I Tube type: Oral Number of attempts: 1 Airway Equipment and Method: Stylet and Oral airway Placement Confirmation: ETT inserted through vocal cords under direct vision, positive ETCO2 and breath sounds checked- equal and bilateral Tube secured with: Tape Dental Injury: Teeth and Oropharynx as per pre-operative assessment

## 2021-05-22 NOTE — Anesthesia Postprocedure Evaluation (Signed)
Anesthesia Post Note  Patient: Jane Nunez  Procedure(s) Performed: HYSTERECTOMY ABDOMINAL WITH SALPINGECTOMY (Abdomen) LYSIS OF ADHESION (Abdomen)     Patient location during evaluation: PACU Anesthesia Type: General Level of consciousness: sedated and patient cooperative Pain management: pain level controlled Vital Signs Assessment: post-procedure vital signs reviewed and stable Respiratory status: spontaneous breathing Cardiovascular status: stable Anesthetic complications: no   No notable events documented.  Last Vitals:  Vitals:   05/22/21 1444 05/22/21 1624  BP: 98/65 101/69  Pulse: 70 77  Resp: 16 16  Temp: 36.4 C 36.7 C  SpO2: 95% 97%    Last Pain:  Vitals:   05/22/21 1624  TempSrc: Oral  PainSc:                  Nolon Nations

## 2021-05-23 ENCOUNTER — Encounter (HOSPITAL_COMMUNITY): Payer: Self-pay | Admitting: Obstetrics and Gynecology

## 2021-05-23 LAB — TYPE AND SCREEN
ABO/RH(D): A POS
Antibody Screen: NEGATIVE
Unit division: 0
Unit division: 0

## 2021-05-23 LAB — CBC
HCT: 27.8 % — ABNORMAL LOW (ref 36.0–46.0)
Hemoglobin: 9.5 g/dL — ABNORMAL LOW (ref 12.0–15.0)
MCH: 30.8 pg (ref 26.0–34.0)
MCHC: 34.2 g/dL (ref 30.0–36.0)
MCV: 90.3 fL (ref 80.0–100.0)
Platelets: 136 10*3/uL — ABNORMAL LOW (ref 150–400)
RBC: 3.08 MIL/uL — ABNORMAL LOW (ref 3.87–5.11)
RDW: 14.1 % (ref 11.5–15.5)
WBC: 6.8 10*3/uL (ref 4.0–10.5)
nRBC: 0 % (ref 0.0–0.2)

## 2021-05-23 LAB — COMPREHENSIVE METABOLIC PANEL
ALT: 15 U/L (ref 0–44)
AST: 26 U/L (ref 15–41)
Albumin: 3 g/dL — ABNORMAL LOW (ref 3.5–5.0)
Alkaline Phosphatase: 23 U/L — ABNORMAL LOW (ref 38–126)
Anion gap: 6 (ref 5–15)
BUN: 5 mg/dL — ABNORMAL LOW (ref 6–20)
CO2: 27 mmol/L (ref 22–32)
Calcium: 8.2 mg/dL — ABNORMAL LOW (ref 8.9–10.3)
Chloride: 108 mmol/L (ref 98–111)
Creatinine, Ser: 0.74 mg/dL (ref 0.44–1.00)
GFR, Estimated: >60 mL/min (ref >60)
Glucose, Bld: 117 mg/dL — ABNORMAL HIGH (ref 70–99)
Potassium: 3.6 mmol/L (ref 3.5–5.1)
Sodium: 141 mmol/L (ref 135–145)
Total Bilirubin: 0.7 mg/dL (ref 0.3–1.2)
Total Protein: 5 g/dL — ABNORMAL LOW (ref 6.5–8.1)

## 2021-05-23 LAB — BPAM RBC
Blood Product Expiration Date: 202302212359
Blood Product Expiration Date: 202302212359
ISSUE DATE / TIME: 202301231011
ISSUE DATE / TIME: 202301231011
Unit Type and Rh: 6200
Unit Type and Rh: 6200

## 2021-05-23 NOTE — Plan of Care (Signed)
Problem: Education: Goal: Knowledge of General Education information will improve Description: Including pain rating scale, medication(s)/side effects and non-pharmacologic comfort measures Outcome: Completed/Met   Problem: Activity: Goal: Risk for activity intolerance will decrease Outcome: Completed/Met   Problem: Nutrition: Goal: Adequate nutrition will be maintained Outcome: Completed/Met   Problem: Coping: Goal: Level of anxiety will decrease Outcome: Completed/Met   Problem: Elimination: Goal: Will not experience complications related to urinary retention Outcome: Completed/Met   Problem: Pain Managment: Goal: General experience of comfort will improve Outcome: Completed/Met

## 2021-05-23 NOTE — Progress Notes (Addendum)
GYN Progress Note   Pt seen at bedside   S: Jane Nunez is doing well this morning. Reports pain is controlled with Toradol and Tylenol, has not needed oxycodone overnight. She has been ambulating around room without lightheadedness or dizziness. She is tolerating a regular diet without nausea or vomiting. She is passing flatus. Foley was removed yesterday evening and she has voided without issue.     Today's Vitals   05/23/21 0425 05/23/21 0529 05/23/21 0633 05/23/21 0737  BP:    (!) 97/57  Pulse:    95  Resp:      Temp:      TempSrc:      SpO2:      Weight:      Height:      PainSc: 0-No pain 0-No pain Asleep       Gen: well appearing, no distress CVS: RRR Lungs: CTAB Abd: soft, appropriately tender, mildly distended, bowel sounds present. Incision covered with clean/dry honeycomb     CBC    Component Value Date/Time   WBC 6.8 05/23/2021 0521   RBC 3.08 (L) 05/23/2021 0521   HGB 9.5 (L) 05/23/2021 0521   HCT 27.8 (L) 05/23/2021 0521   PLT 136 (L) 05/23/2021 0521   MCV 90.3 05/23/2021 0521   MCH 30.8 05/23/2021 0521   MCHC 34.2 05/23/2021 0521   RDW 14.1 05/23/2021 0521   LYMPHSABS 1.3 05/22/2021 1220   MONOABS 0.5 05/22/2021 1220   EOSABS 0.2 05/22/2021 1220   BASOSABS 0.0 05/22/2021 1220   CMP     Component Value Date/Time   NA 141 05/23/2021 0521   K 3.6 05/23/2021 0521   CL 108 05/23/2021 0521   CO2 27 05/23/2021 0521   GLUCOSE 117 (H) 05/23/2021 0521   BUN 5 (L) 05/23/2021 0521   CREATININE 0.74 05/23/2021 0521   CALCIUM 8.2 (L) 05/23/2021 0521   PROT 5.0 (L) 05/23/2021 0521   ALBUMIN 3.0 (L) 05/23/2021 0521   AST 26 05/23/2021 0521   ALT 15 05/23/2021 0521   ALKPHOS 23 (L) 05/23/2021 0521   BILITOT 0.7 05/23/2021 0521   GFRNONAA >60 05/23/2021 0521   GFRAA >60 06/17/2015 1220      A/P POD1 s/p TAH, salpingectomy, LOA for large fibroid uterus.  - Doing very well postoperatively, continue routine postop care - ABLA, Surgery complicated by intraop EBL  2058mL and blood transfusion (2 units)s: postop Hgb 9.5 this AM, appropriate. She is asymptomatic.  - Encouraged ambulation - HTN: continue home meds (HCTZ and Valsartan) - Dispo: anticipate discharge home tomorrow   M. Brien Mates, MD 05/23/21 7:48 AM

## 2021-05-23 NOTE — Progress Notes (Signed)
° ° °  1 Day Post-Op  Subjective: CC: Patient reports some soreness around her incision. Feels her pain is well controlled. Tolerating regular diet without n/v. Passing flatus. Voiding since foley removal. Reports she is mobilizing without lightheadedness or difficulty. Received 2U PRBC yesterday. Hgb 9.5 this am.   Objective: Vital signs in last 24 hours: Temp:  [97.6 F (36.4 C)-99.1 F (37.3 C)] 98 F (36.7 C) (01/24 0737) Pulse Rate:  [70-109] 95 (01/24 0737) Resp:  [14-20] 18 (01/24 0737) BP: (89-118)/(56-73) 97/57 (01/24 0737) SpO2:  [94 %-100 %] 98 % (01/24 0737)    Intake/Output from previous day: 01/23 0701 - 01/24 0700 In: 7628.2 [P.O.:1470; I.V.:4778.2; Blood:630; IV Piggyback:750] Out: 4500 [Urine:2500; Blood:2000] Intake/Output this shift: Total I/O In: 333.4 [I.V.:333.4] Out: -   PE: Gen:  Alert, NAD, pleasant Pulm: Normal rate and effort  Abd: Soft, ND, appropriately tender around pfannenstiel incision. Pfannenstiel incision with honeycomb dressing in place and c/d/I.  Psych: A&Ox3  Skin: no rashes noted, warm and dry  Lab Results:  Recent Labs    05/22/21 1220 05/23/21 0521  WBC 7.1 6.8  HGB 11.4* 9.5*  HCT 33.5* 27.8*  PLT 156 136*   BMET Recent Labs    05/23/21 0521  NA 141  K 3.6  CL 108  CO2 27  GLUCOSE 117*  BUN 5*  CREATININE 0.74  CALCIUM 8.2*   PT/INR Recent Labs    05/22/21 1220  LABPROT 15.5*  INR 1.2   CMP     Component Value Date/Time   NA 141 05/23/2021 0521   K 3.6 05/23/2021 0521   CL 108 05/23/2021 0521   CO2 27 05/23/2021 0521   GLUCOSE 117 (H) 05/23/2021 0521   BUN 5 (L) 05/23/2021 0521   CREATININE 0.74 05/23/2021 0521   CALCIUM 8.2 (L) 05/23/2021 0521   PROT 5.0 (L) 05/23/2021 0521   ALBUMIN 3.0 (L) 05/23/2021 0521   AST 26 05/23/2021 0521   ALT 15 05/23/2021 0521   ALKPHOS 23 (L) 05/23/2021 0521   BILITOT 0.7 05/23/2021 0521   GFRNONAA >60 05/23/2021 0521   GFRAA >60 06/17/2015 1220   Lipase  No  results found for: LIPASE  Studies/Results: No results found.  Anti-infectives: Anti-infectives (From admission, onward)    Start     Dose/Rate Route Frequency Ordered Stop   05/22/21 0600  ceFAZolin (ANCEF) IVPB 2g/100 mL premix        2 g 200 mL/hr over 30 Minutes Intravenous On call to O.R. 05/22/21 0551 05/22/21 0752        Assessment/Plan POD 1 s/p LOA x 20 minutes by Dr. Georganna Skeans for intra-op consult by [facility] for intra-abdominal adhesions during TAH by Dr. Irene Pap - 05/22/2021 - Doing well from our standpoint. Pain well controlled. Tolerating diet without n/v. Passing flatus. Voided since foley removal. We will be available as needed moving forward. Dispo per primary team.   FEN - Reg VTE - SCDs, okay for chemical prophylaxis from a general surgery standpoint ID - Ancef periop Foley - None currently   ABL anemia - 2U PRBC intra-op. Hgb 9.5 this am.   Moderate Medical Decision Making   LOS: 1 day    Jillyn Ledger , [facility] 05/23/2021, 10:19 AM Please see Amion for pager number during day hours 7:00am-4:30pm

## 2021-05-24 LAB — POCT I-STAT EG7
Acid-base deficit: 1 mmol/L (ref 0.0–2.0)
Acid-base deficit: 6 mmol/L — ABNORMAL HIGH (ref 0.0–2.0)
Bicarbonate: 24.1 mmol/L (ref 20.0–28.0)
Bicarbonate: 19.4 mmol/L — ABNORMAL LOW (ref 20.0–28.0)
Calcium, Ion: 1.19 mmol/L (ref 1.15–1.40)
Calcium, Ion: 0.94 mmol/L — ABNORMAL LOW (ref 1.15–1.40)
HCT: 33 % — ABNORMAL LOW (ref 36.0–46.0)
HCT: 24 % — ABNORMAL LOW (ref 36.0–46.0)
Hemoglobin: 11.2 g/dL — ABNORMAL LOW (ref 12.0–15.0)
Hemoglobin: 8.2 g/dL — ABNORMAL LOW (ref 12.0–15.0)
O2 Saturation: 89 %
O2 Saturation: 74 %
Patient temperature: 36.6
Potassium: 3.7 mmol/L (ref 3.5–5.1)
Potassium: 3.1 mmol/L — ABNORMAL LOW (ref 3.5–5.1)
Sodium: 138 mmol/L (ref 135–145)
Sodium: 140 mmol/L (ref 135–145)
TCO2: 25 mmol/L (ref 22–32)
TCO2: 20 mmol/L — ABNORMAL LOW (ref 22–32)
pCO2, Ven: 40.3 mmHg — ABNORMAL LOW (ref 44.0–60.0)
pCO2, Ven: 37.2 mmHg — ABNORMAL LOW (ref 44.0–60.0)
pH, Ven: 7.383 (ref 7.250–7.430)
pH, Ven: 7.324 (ref 7.250–7.430)
pO2, Ven: 57 mmHg — ABNORMAL HIGH (ref 32.0–45.0)
pO2, Ven: 42 mmHg (ref 32.0–45.0)

## 2021-05-24 MED ORDER — SIMETHICONE 80 MG PO CHEW: 80.0000 mg | CHEWABLE_TABLET | Freq: Four times a day (QID) | ORAL | 0 refills | Status: AC | PRN

## 2021-05-24 MED ORDER — IBUPROFEN 600 MG PO TABS: 600.0000 mg | ORAL_TABLET | Freq: Four times a day (QID) | ORAL | 1 refills | Status: AC | PRN

## 2021-05-24 NOTE — Progress Notes (Signed)
GYN Progress Note   Pt seen at bedside   S: Jane Nunez is doing well this morning. Reports pain is controlled with Toradol and Tylenol, has not needed oxycodone. She has been ambulating around room without lightheadedness or dizziness. She is tolerating a regular diet without nausea or vomiting. She is passing flatus and had a bowel movement. She is voiding without issues. No VB   Today's Vitals   05/24/21 0558 05/24/21 0635 05/24/21 0750 05/24/21 0800  BP:   129/90   Pulse:   92   Resp:   18   Temp:   98.3 F (36.8 C)   TempSrc:   Oral   SpO2:   98%   Weight:      Height:      PainSc: 0-No pain 0-No pain  0-No pain   Body mass index is 38.44 kg/m.    Gen: well appearing, no distress CVS: RRR Lungs: CTAB Abd: soft, appropriately tender, mildly distended, bowel sounds present. Incision covered with clean/dry honeycomb        A/P POD2 s/p TAH, salpingectomy, LOA for large fibroid uterus.  - Meeting postop milestones - ABLA, Surgery complicated by intraop EBL 2055mL and blood transfusion (2 units)s: postop Hgb 9.5 appropriate. She is asymptomatic.  - Encouraged ambulation - HTN: continue home meds (HCTZ and Valsartan) - Dispo: meeting postop milestones, stable for discharge. Instructions reviewed.    Luther Redo, MD 05/24/21 8:47 AM

## 2021-05-24 NOTE — Progress Notes (Signed)
Discharge instructions reviewed with patient and family member. Patient verbalized an understanding of instructions.

## 2021-05-24 NOTE — Discharge Summary (Signed)
Physician Discharge Summary  Patient ID: Jane Nunez MRN: [id number] DOB/AGE: 30-Sep-1965 56 y.o.  Admit date: 05/22/2021 Discharge date: 05/24/2021  Admission Diagnoses: Uterine fibroid  Discharge Diagnoses:  Principal Problem:   H/O total hysterectomy   Discharged Condition: good  Hospital Course:  1/23 Patient admitted for scheduled surgery. Underwent TAH, bilateral salpingectomy, lysis of adhesions. Intraop blood loss 2027ml, transfused 2U PRBC. Did well postoperatively 1/24 POD1 ambulating, voiding, tolerating PO, Hgb 9.5 1/25 POD2 meeting postop milestones, discharged home  Consults:  intraop consult to General Surgery for small bowel adhesions  Significant Diagnostic Studies: CBC    Component Value Date/Time   WBC 6.8 05/23/2021 0521   RBC 3.08 (L) 05/23/2021 0521   HGB 9.5 (L) 05/23/2021 0521   HCT 27.8 (L) 05/23/2021 0521   PLT 136 (L) 05/23/2021 0521   MCV 90.3 05/23/2021 0521   MCH 30.8 05/23/2021 0521   MCHC 34.2 05/23/2021 0521   RDW 14.1 05/23/2021 0521   LYMPHSABS 1.3 05/22/2021 1220   MONOABS 0.5 05/22/2021 1220   EOSABS 0.2 05/22/2021 1220   BASOSABS 0.0 05/22/2021 1220  '  Treatments: scheduled surgery  Discharge Exam: Blood pressure 129/90, pulse 92, temperature 98.3 F (36.8 C), temperature source Oral, resp. rate 18, height 5\' 3"  (1.6 m), weight 98.4 kg, SpO2 98 %. Gen: well appearing, no distress CVS: RRR Lungs: CTAB Abd: soft, appropriately tender, mildly distended, bowel sounds present. Incision covered with clean/dry honeycomb  Disposition: Discharge disposition: 01-Home or Self Care       Discharge Instructions      Remove dressing in 72 hours   Complete by: As directed    Call MD for:   Complete by: As directed    Heavy vaginal bleeding or abnormal vaginal discharge   Call MD for:  difficulty breathing, headache or visual disturbances   Complete by: As directed    Call MD for:  extreme fatigue   Complete by: As directed     Call MD for:  hives   Complete by: As directed    Call MD for:  persistant dizziness or light-headedness   Complete by: As directed    Call MD for:  persistant nausea and vomiting   Complete by: As directed    Call MD for:  redness, tenderness, or signs of infection (pain, swelling, redness, odor or green/yellow discharge around incision site)   Complete by: As directed    Call MD for:  severe uncontrolled pain   Complete by: As directed    Call MD for:  temperature >100.4   Complete by: As directed    Diet - low sodium heart healthy   Complete by: As directed    Driving Restrictions   Complete by: As directed    No driving x 2 weeks   Increase activity slowly   Complete by: As directed    Lifting restrictions   Complete by: As directed    No heavy lifting more than 10 lbs   Sexual Activity Restrictions   Complete by: As directed    Nothing in vagina x 6 weeks      Allergies as of 05/24/2021   No Known Allergies      Medication List     TAKE these medications    acetaminophen 500 MG tablet Commonly known as: TYLENOL Take 1,000 mg by mouth every 6 (six) hours as needed for moderate pain.   hydrochlorothiazide 12.5 MG capsule Commonly known as: MICROZIDE Take 12.5 mg by mouth daily.  ibuprofen 600 MG tablet Commonly known as: ADVIL Take 1 tablet (600 mg total) by mouth every 6 (six) hours as needed.   simethicone 80 MG chewable tablet Commonly known as: MYLICON Chew 1 tablet (80 mg total) by mouth 4 (four) times daily as needed for flatulence.   valsartan 40 MG tablet Commonly known as: Diovan Take 1 tablet (40 mg total) by mouth daily.        Follow-up Information     Surgery, [place]. Call.   Specialty: General Surgery Why: As needed Contact information: [address] [place] [address] [phone]         Rowland Lathe, MD. Go in 2 week(s).   Specialty: Obstetrics and Gynecology Why: Already  scheduled Contact information: [address] [place] [address] [phone]                 Signed: Rowland Lathe 05/24/2021, 8:47 AM

## 2021-05-27 LAB — SURGICAL PATHOLOGY

## 2021-06-28 ENCOUNTER — Ambulatory Visit (INDEPENDENT_AMBULATORY_CARE_PROVIDER_SITE_OTHER): Payer: 59 | Admitting: Cardiology

## 2021-06-28 ENCOUNTER — Other Ambulatory Visit: Payer: Self-pay

## 2021-06-28 ENCOUNTER — Encounter: Payer: Self-pay | Admitting: Cardiology

## 2021-06-28 VITALS — BP 130/90 | HR 67 | Ht 63.0 in | Wt 215.8 lb

## 2021-06-28 DIAGNOSIS — E782 Mixed hyperlipidemia: Secondary | ICD-10-CM

## 2021-06-28 DIAGNOSIS — E669 Obesity, unspecified: Secondary | ICD-10-CM | POA: Diagnosis not present

## 2021-06-28 DIAGNOSIS — I10 Essential (primary) hypertension: Secondary | ICD-10-CM | POA: Diagnosis not present

## 2021-06-28 NOTE — Progress Notes (Signed)
Cardiology Office Note:    Date:  06/28/2021   ID:  Kem Parcher, DOB 05/23/1965, MRN [id number]  PCP:  Vonna Drafts, FNP  Cardiologist:  Berniece Salines, DO  Electrophysiologist:  None   Referring MD: Vonna Drafts, FNP     History of Present Illness:    Jane Nunez is a 56 y.o. female with a hx of hypertension, hyperlipidemia here today for follow-up visit.  I initially saw the patient on March 30, 2021 at that time she was hypertensive.  I added valsartan 40 mg to her regimen.  I also ordered an echocardiogram for the patient.  In terms of her surgery clearance we cleared the patient for her procedure.  Past Medical History:  Diagnosis Date   Hyperlipidemia    Hypertension    Thyroid disease    tumor and oversized-has surgery planned.  No cancer    Past Surgical History:  Procedure Laterality Date   CESAREAN SECTION     DOPPLER ECHOCARDIOGRAPHY     04-17-2021  EF 55-60%, no AVS   HYSTERECTOMY ABDOMINAL WITH SALPINGECTOMY N/A 05/22/2021   Procedure: HYSTERECTOMY ABDOMINAL WITH SALPINGECTOMY;  Surgeon: Rowland Lathe, MD;  Location: [facility];  Service: Gynecology;  Laterality: N/A;   LYSIS OF ADHESION  05/22/2021   Procedure: LYSIS OF ADHESION;  Surgeon: Georganna Skeans, MD;  Location: [facility];  Service: General;;    Current Medications: Current Meds  Medication Sig   acetaminophen (TYLENOL) 500 MG tablet Take 1,000 mg by mouth every 6 (six) hours as needed for moderate pain.   hydrochlorothiazide (MICROZIDE) 12.5 MG capsule Take 12.5 mg by mouth daily.   ibuprofen (ADVIL) 600 MG tablet Take 1 tablet (600 mg total) by mouth every 6 (six) hours as needed.   valsartan (DIOVAN) 40 MG tablet Take 1 tablet (40 mg total) by mouth daily.     Allergies:   Patient has no known allergies.   Social History   Socioeconomic History   Marital status: Married    Spouse name: Not on file   Number of children: Not on file   Years of education: Not on file   Highest  education level: Not on file  Occupational History   Not on file  Tobacco Use   Smoking status: Never   Smokeless tobacco: Never  Vaping Use   Vaping Use: Never used  Substance and Sexual Activity   Alcohol use: Yes    Comment: occasionally   Drug use: No   Sexual activity: Not on file  Other Topics Concern   Not on file  Social History Narrative   Not on file   Social Determinants of Health   Financial Resource Strain: Not on file  Food Insecurity: Not on file  Transportation Needs: Not on file  Physical Activity: Not on file  Stress: Not on file  Social Connections: Not on file     Family History: The patient's family history includes Aneurysm in her father; Diabetes in her mother; Esophageal cancer in her sister; Hypertension in her daughter and mother. There is no history of Colon cancer, Colon polyps, Stomach cancer, or Rectal cancer.  ROS:   Review of Systems  Constitution: Negative for decreased appetite, fever and weight gain.  HENT: Negative for congestion, ear discharge, hoarse voice and sore throat.   Eyes: Negative for discharge, redness, vision loss in right eye and visual halos.  Cardiovascular: Negative for chest pain, dyspnea on exertion, leg swelling, orthopnea and palpitations.  Respiratory: Negative for  cough, hemoptysis, shortness of breath and snoring.   Endocrine: Negative for heat intolerance and polyphagia.  Hematologic/Lymphatic: Negative for bleeding problem. Does not bruise/bleed easily.  Skin: Negative for flushing, nail changes, rash and suspicious lesions.  Musculoskeletal: Negative for arthritis, joint pain, muscle cramps, myalgias, neck pain and stiffness.  Gastrointestinal: Negative for abdominal pain, bowel incontinence, diarrhea and excessive appetite.  Genitourinary: Negative for decreased libido, genital sores and incomplete emptying.  Neurological: Negative for brief paralysis, focal weakness, headaches and loss of balance.   Psychiatric/Behavioral: Negative for altered mental status, depression and suicidal ideas.  Allergic/Immunologic: Negative for HIV exposure and persistent infections.    EKGs/Labs/Other Studies Reviewed:    The following studies were reviewed today:   EKG:  none today   TTE 04/17/2021 IMPRESSIONS   1. Left ventricular ejection fraction, by estimation, is 55 to 60%. The left ventricle has normal function. The left ventricle has no regional wall motion abnormalities. Left ventricular diastolic parameters are consistent with Grade I diastolic  dysfunction (impaired relaxation).   2. Right ventricular systolic function is normal. The right ventricular size is normal. Tricuspid regurgitation signal is inadequate for assessing  PA pressure.   3. The mitral valve is normal in structure. Trivial mitral valve regurgitation.   4. The aortic valve is tricuspid. There is mild thickening of the aortic valve. Aortic valve regurgitation is trivial. Aortic valve sclerosis is  present, with no evidence of aortic valve stenosis.   5. The inferior vena cava is normal in size with greater than 50% respiratory variability, suggesting right atrial pressure of 3 mmHg.   Comparison(s): No prior Echocardiogram.   Recent Labs: 05/23/2021: ALT 15; BUN 5; Creatinine, Ser 0.74; Hemoglobin 9.5; Platelets 136; Potassium 3.6; Sodium 141  Recent Lipid Panel No results found for: CHOL, TRIG, HDL, CHOLHDL, VLDL, LDLCALC, LDLDIRECT  Physical Exam:    VS:  BP 130/90 (BP Location: Left Arm, Patient Position: Sitting)    Pulse 67    Ht 5\' 3"  (1.6 m)    Wt 215 lb 12.8 oz (97.9 kg)    SpO2 98%    BMI 38.23 kg/m     Wt Readings from Last 3 Encounters:  06/28/21 215 lb 12.8 oz (97.9 kg)  05/22/21 217 lb (98.4 kg)  05/18/21 220 lb 12.8 oz (100.2 kg)     GEN: Well nourished, well developed in no acute distress HEENT: Normal NECK: No JVD; No carotid bruits LYMPHATICS: No lymphadenopathy CARDIAC: S1S2 noted,RRR, no  murmurs, rubs, gallops RESPIRATORY:  Clear to auscultation without rales, wheezing or rhonchi  ABDOMEN: Soft, non-tender, non-distended, +bowel sounds, no guarding. EXTREMITIES: No edema, No cyanosis, no clubbing MUSCULOSKELETAL:  No deformity  SKIN: Warm and dry NEUROLOGIC:  Alert and oriented x 3, non-focal PSYCHIATRIC:  Normal affect, good insight  ASSESSMENT:    1. Primary hypertension   2. Obesity (BMI 30-39.9)   3. Mixed hyperlipidemia    PLAN:    Blood pressure has improved since I last saw the patient.  She responded well to the valsartan.  Continue HCTZ 12.5 mg daily with valsartan 40 mg daily.  The patient understands the need to lose weight with diet and exercise. We have discussed specific strategies for this.  The patient is in agreement with the above plan. The patient left the office in stable condition.  The patient will follow up in 1 year or sooner if needed.   Medication Adjustments/Labs and Tests Ordered: Current medicines are reviewed at length with the patient today.  Concerns regarding medicines are outlined above.  No orders of the defined types were placed in this encounter.  No orders of the defined types were placed in this encounter.   Patient Instructions  Medication Instructions:  Your physician recommends that you continue on your current medications as directed. Please refer to the Current Medication list given to you today.  *If you need a refill on your cardiac medications before your next appointment, please call your pharmacy*   Lab Work: None If you have labs (blood work) drawn today and your tests are completely normal, you will receive your results only by: [place] (if you have MyChart) OR A paper copy in the mail If you have any lab test that is abnormal or we need to change your treatment, we will call you to review the results.   Testing/Procedures: None   Follow-Up: At [facility], you and your health needs are  our priority.  As part of our continuing mission to provide you with exceptional heart care, we have created designated Provider Care Teams.  These Care Teams include your primary Cardiologist (physician) and Advanced Practice Providers (APPs -  Physician Assistants and Nurse Practitioners) who all work together to provide you with the care you need, when you need it.  We recommend signing up for the patient portal called "MyChart".  Sign up information is provided on this After Visit Summary.  MyChart is used to connect with patients for Virtual Visits (Telemedicine).  Patients are able to view lab/test results, encounter notes, upcoming appointments, etc.  Non-urgent messages can be sent to your provider as well.   To learn more about what you can do with MyChart, go to [email or web address].    Your next appointment:   1 year(s)  The format for your next appointment:   In Person  Provider:   Berniece Salines, DO     Other Instructions     Adopting a Healthy Lifestyle.  Know what a healthy weight is for you (roughly BMI <25) and aim to maintain this   Aim for 7+ servings of fruits and vegetables daily   65-80+ fluid ounces of water or unsweet tea for healthy kidneys   Limit to max 1 drink of alcohol per day; avoid smoking/tobacco   Limit animal fats in diet for cholesterol and heart health - choose grass fed whenever available   Avoid highly processed foods, and foods high in saturated/trans fats   Aim for low stress - take time to unwind and care for your mental health   Aim for 150 min of moderate intensity exercise weekly for heart health, and weights twice weekly for bone health   Aim for 7-9 hours of sleep daily   When it comes to diets, agreement about the perfect plan isnt easy to find, even among the experts. Experts at the [employer] developed an idea known as the Healthy Eating Plate. Just imagine a plate divided into logical, healthy portions.    The emphasis is on diet quality:   Load up on vegetables and fruits - one-half of your plate: Aim for color and variety, and remember that potatoes dont count.   Go for whole grains - one-quarter of your plate: Whole wheat, barley, wheat berries, quinoa, oats, brown rice, and foods made with them. If you want pasta, go with whole wheat pasta.   Protein power - one-quarter of your plate: Fish, chicken, beans, and nuts are all healthy, versatile protein sources. Limit red  meat.   The diet, however, does go beyond the plate, offering a few other suggestions.   Use healthy plant oils, such as olive, canola, soy, corn, sunflower and peanut. Check the labels, and avoid partially hydrogenated oil, which have unhealthy trans fats.   If youre thirsty, drink water. Coffee and tea are good in moderation, but skip sugary drinks and limit milk and dairy products to one or two daily servings.   The type of carbohydrate in the diet is more important than the amount. Some sources of carbohydrates, such as vegetables, fruits, whole grains, and beans-are healthier than others.   Finally, stay active  Signed, Berniece Salines, DO  06/28/2021 8:22 AM    [place]

## 2021-06-28 NOTE — Patient Instructions (Signed)

## 2022-11-06 ENCOUNTER — Other Ambulatory Visit: Payer: Self-pay | Admitting: Nurse Practitioner

## 2022-11-06 DIAGNOSIS — Z1231 Encounter for screening mammogram for malignant neoplasm of breast: Secondary | ICD-10-CM

## 2022-11-30 ENCOUNTER — Ambulatory Visit
Admission: RE | Admit: 2022-11-30 | Discharge: 2022-11-30 | Disposition: A | Discharge status: Home / Self Care | Payer: BC Managed Care – PPO | Source: Ambulatory Visit | Attending: Nurse Practitioner | Admitting: Nurse Practitioner

## 2022-11-30 DIAGNOSIS — Z1231 Encounter for screening mammogram for malignant neoplasm of breast: Secondary | ICD-10-CM

## 2022-12-04 ENCOUNTER — Other Ambulatory Visit: Payer: Self-pay | Admitting: Nurse Practitioner

## 2022-12-04 DIAGNOSIS — R928 Other abnormal and inconclusive findings on diagnostic imaging of breast: Secondary | ICD-10-CM

## 2022-12-18 ENCOUNTER — Ambulatory Visit
Admission: RE | Admit: 2022-12-18 | Discharge: 2022-12-18 | Disposition: A | Discharge status: Home / Self Care | Payer: BC Managed Care – PPO | Source: Ambulatory Visit | Attending: Nurse Practitioner | Admitting: Nurse Practitioner

## 2022-12-18 ENCOUNTER — Other Ambulatory Visit: Payer: Self-pay | Admitting: Nurse Practitioner

## 2022-12-18 DIAGNOSIS — R928 Other abnormal and inconclusive findings on diagnostic imaging of breast: Secondary | ICD-10-CM

## 2022-12-18 DIAGNOSIS — N6320 Unspecified lump in the left breast, unspecified quadrant: Secondary | ICD-10-CM

## 2022-12-18 DIAGNOSIS — R921 Mammographic calcification found on diagnostic imaging of breast: Secondary | ICD-10-CM

## 2022-12-20 ENCOUNTER — Ambulatory Visit
Admission: RE | Admit: 2022-12-20 | Discharge: 2022-12-20 | Disposition: A | Discharge status: Home / Self Care | Payer: BC Managed Care – PPO | Source: Ambulatory Visit | Attending: Nurse Practitioner | Admitting: Nurse Practitioner

## 2022-12-20 DIAGNOSIS — N6320 Unspecified lump in the left breast, unspecified quadrant: Secondary | ICD-10-CM

## 2022-12-20 DIAGNOSIS — R921 Mammographic calcification found on diagnostic imaging of breast: Secondary | ICD-10-CM

## 2022-12-20 DIAGNOSIS — R928 Other abnormal and inconclusive findings on diagnostic imaging of breast: Secondary | ICD-10-CM

## 2022-12-20 HISTORY — PX: BREAST BIOPSY: SHX20

## 2022-12-21 ENCOUNTER — Other Ambulatory Visit: Payer: BC Managed Care – PPO
